# Patient Record
Sex: Female | Born: 1995 | Race: Black or African American | Hispanic: No | Marital: Single | Smoking: Current every day smoker
Health system: Southern US, Community
[De-identification: ages and names within clinical notes are randomized; demographics above are authoritative.]

## PROBLEM LIST (undated history)

## (undated) DIAGNOSIS — A549 Gonococcal infection, unspecified: Secondary | ICD-10-CM

## (undated) DIAGNOSIS — F32A Depression, unspecified: Secondary | ICD-10-CM

## (undated) DIAGNOSIS — A749 Chlamydial infection, unspecified: Secondary | ICD-10-CM

## (undated) DIAGNOSIS — D649 Anemia, unspecified: Secondary | ICD-10-CM

## (undated) DIAGNOSIS — J45909 Unspecified asthma, uncomplicated: Secondary | ICD-10-CM

## (undated) DIAGNOSIS — R519 Headache, unspecified: Secondary | ICD-10-CM

## (undated) DIAGNOSIS — A599 Trichomoniasis, unspecified: Secondary | ICD-10-CM

## (undated) DIAGNOSIS — F419 Anxiety disorder, unspecified: Secondary | ICD-10-CM

## (undated) DIAGNOSIS — N390 Urinary tract infection, site not specified: Secondary | ICD-10-CM

## (undated) HISTORY — PX: NO PAST SURGERIES: SHX2092

## (undated) HISTORY — DX: Unspecified asthma, uncomplicated: J45.909

## (undated) HISTORY — PX: INDUCED ABORTION: SHX677

---

## 2018-08-24 ENCOUNTER — Other Ambulatory Visit: Payer: Self-pay

## 2018-08-24 ENCOUNTER — Encounter: Payer: Self-pay | Admitting: Obstetrics and Gynecology

## 2018-08-24 ENCOUNTER — Ambulatory Visit: Payer: Self-pay | Admitting: Emergency Medicine

## 2018-08-24 DIAGNOSIS — Z3200 Encounter for pregnancy test, result unknown: Secondary | ICD-10-CM

## 2018-08-24 DIAGNOSIS — Z3201 Encounter for pregnancy test, result positive: Secondary | ICD-10-CM

## 2018-08-24 LAB — POCT PREGNANCY, URINE: Preg Test, Ur: POSITIVE — AB

## 2018-08-24 NOTE — Progress Notes (Signed)
Pt here today for a UPT. UPT has a positive result today. Pt LMP 07/06/18, EDD 04/11/18, [place] [redacted]w[redacted]d. Pt medications and allergies reconciled. Pt given a list of medications safe to take during pregnancy and encouraged to start taking prenatal vitamins. Pt verbalized understanding and had no further questions.

## 2018-08-24 NOTE — Progress Notes (Signed)
Chart reviewed for nurse visit. Agree with plan of care.   Wende Mott, [place] 08/24/2018 10:22 AM

## 2018-08-25 ENCOUNTER — Telehealth (INDEPENDENT_AMBULATORY_CARE_PROVIDER_SITE_OTHER): Payer: Self-pay | Admitting: *Deleted

## 2018-08-25 ENCOUNTER — Other Ambulatory Visit: Payer: Self-pay

## 2018-08-25 ENCOUNTER — Inpatient Hospital Stay (HOSPITAL_COMMUNITY): Payer: Self-pay

## 2018-08-25 ENCOUNTER — Telehealth: Payer: Self-pay

## 2018-08-25 ENCOUNTER — Inpatient Hospital Stay (HOSPITAL_COMMUNITY)
Admission: AD | Admit: 2018-08-25 | Discharge: 2018-08-25 | Disposition: A | Discharge status: Home / Self Care | Payer: Self-pay | Attending: Obstetrics & Gynecology | Admitting: Obstetrics & Gynecology

## 2018-08-25 ENCOUNTER — Encounter (HOSPITAL_COMMUNITY): Payer: Self-pay | Admitting: Advanced Practice Midwife

## 2018-08-25 ENCOUNTER — Inpatient Hospital Stay: Admission: RE | Admit: 2018-08-25 | Payer: Self-pay | Source: Ambulatory Visit

## 2018-08-25 DIAGNOSIS — Z3480 Encounter for supervision of other normal pregnancy, unspecified trimester: Secondary | ICD-10-CM

## 2018-08-25 DIAGNOSIS — O26891 Other specified pregnancy related conditions, first trimester: Secondary | ICD-10-CM | POA: Insufficient documentation

## 2018-08-25 DIAGNOSIS — Z3A01 Less than 8 weeks gestation of pregnancy: Secondary | ICD-10-CM | POA: Insufficient documentation

## 2018-08-25 DIAGNOSIS — O418X10 Other specified disorders of amniotic fluid and membranes, first trimester, not applicable or unspecified: Secondary | ICD-10-CM

## 2018-08-25 DIAGNOSIS — O468X1 Other antepartum hemorrhage, first trimester: Secondary | ICD-10-CM

## 2018-08-25 DIAGNOSIS — O208 Other hemorrhage in early pregnancy: Secondary | ICD-10-CM

## 2018-08-25 DIAGNOSIS — R109 Unspecified abdominal pain: Secondary | ICD-10-CM

## 2018-08-25 DIAGNOSIS — M545 Low back pain: Secondary | ICD-10-CM | POA: Insufficient documentation

## 2018-08-25 LAB — URINALYSIS, ROUTINE W REFLEX MICROSCOPIC
Bilirubin Urine: NEGATIVE
Glucose, UA: NEGATIVE mg/dL
Hgb urine dipstick: NEGATIVE
Ketones, ur: NEGATIVE mg/dL
Leukocytes,Ua: NEGATIVE
Nitrite: NEGATIVE
Protein, ur: NEGATIVE mg/dL
Specific Gravity, Urine: 1.01 (ref 1.005–1.030)
pH: 6 (ref 5.0–8.0)

## 2018-08-25 LAB — CBC WITH DIFFERENTIAL/PLATELET
Abs Immature Granulocytes: 0.07 10*3/uL (ref 0.00–0.07)
Basophils Absolute: 0 10*3/uL (ref 0.0–0.1)
Basophils Relative: 0 %
Eosinophils Absolute: 0 10*3/uL (ref 0.0–0.5)
Eosinophils Relative: 0 %
HCT: 36 % (ref 36.0–46.0)
Hemoglobin: 12.3 g/dL (ref 12.0–15.0)
Immature Granulocytes: 1 %
Lymphocytes Relative: 25 %
Lymphs Abs: 2.6 10*3/uL (ref 0.7–4.0)
MCH: 31.2 pg (ref 26.0–34.0)
MCHC: 34.2 g/dL (ref 30.0–36.0)
MCV: 91.4 fL (ref 80.0–100.0)
Monocytes Absolute: 0.5 10*3/uL (ref 0.1–1.0)
Monocytes Relative: 5 %
Neutro Abs: 7.3 10*3/uL (ref 1.7–7.7)
Neutrophils Relative %: 69 %
Platelets: 271 10*3/uL (ref 150–400)
RBC: 3.94 MIL/uL (ref 3.87–5.11)
RDW: 13.9 % (ref 11.5–15.5)
WBC: 10.5 10*3/uL (ref 4.0–10.5)
nRBC: 0 % (ref 0.0–0.2)

## 2018-08-25 LAB — WET PREP, GENITAL
Clue Cells Wet Prep HPF POC: NONE SEEN
Sperm: NONE SEEN
Trich, Wet Prep: NONE SEEN
Yeast Wet Prep HPF POC: NONE SEEN

## 2018-08-25 LAB — HCG, QUANTITATIVE, PREGNANCY: hCG, Beta Chain, Quant, S: 43973 m[IU]/mL — ABNORMAL HIGH (ref <5)

## 2018-08-25 NOTE — MAU Note (Signed)
Pt eating [employer] in lobby, when called her name.  Having cramps, severe in lower abd, started earlier in the week. Had some spotting 2 or 3 days ago, none now.  Pregnancy confirmed at a clinic on [facility], "has my chart" with them,

## 2018-08-25 NOTE — ED Notes (Signed)
Sam in MAU given report on patient, transport called to take patient over to MAU

## 2018-08-25 NOTE — Discharge Instructions (Signed)
First Trimester of Pregnancy  The first trimester of pregnancy is from week 1 until the end of week 13 (months 1 through 3). During this time, your baby will begin to develop inside you. At 6-8 weeks, the eyes and face are formed, and the heartbeat can be seen on ultrasound. At the end of 12 weeks, all the baby's organs are formed. Prenatal care is all the medical care you receive before the birth of your baby. Make sure you get good prenatal care and follow all of your doctor's instructions. Follow these instructions at home: Medicines  Take over-the-counter and prescription medicines only as told by your doctor. Some medicines are safe and some medicines are not safe during pregnancy.  Take a prenatal vitamin that contains at least 600 micrograms (mcg) of folic acid.  If you have trouble pooping (constipation), take medicine that will make your stool soft (stool softener) if your doctor approves. Eating and drinking   Eat regular, healthy meals.  Your doctor will tell you the amount of weight gain that is right for you.  Avoid raw meat and uncooked cheese.  If you feel sick to your stomach (nauseous) or throw up (vomit): ? Eat 4 or 5 small meals a day instead of 3 large meals. ? Try eating a few soda crackers. ? Drink liquids between meals instead of during meals.  To prevent constipation: ? Eat foods that are high in fiber, like fresh fruits and vegetables, whole grains, and beans. ? Drink enough fluids to keep your pee (urine) clear or pale yellow. Activity  Exercise only as told by your doctor. Stop exercising if you have cramps or pain in your lower belly (abdomen) or low back.  Do not exercise if it is too hot, too humid, or if you are in a place of great height (high altitude).  Try to avoid standing for long periods of time. Move your legs often if you must stand in one place for a long time.  Avoid heavy lifting.  Wear low-heeled shoes. Sit and stand up straight.   You can have sex unless your doctor tells you not to. Relieving pain and discomfort  Wear a good support bra if your breasts are sore.  Take warm water baths (sitz baths) to soothe pain or discomfort caused by hemorrhoids. Use hemorrhoid cream if your doctor says it is okay.  Rest with your legs raised if you have leg cramps or low back pain.  If you have puffy, bulging veins (varicose veins) in your legs: ? Wear support hose or compression stockings as told by your doctor. ? Raise (elevate) your feet for 15 minutes, 3-4 times a day. ? Limit salt in your food. Prenatal care  Schedule your prenatal visits by the twelfth week of pregnancy.  Write down your questions. Take them to your prenatal visits.  Keep all your prenatal visits as told by your doctor. This is important. Safety  Wear your seat belt at all times when driving.  Make a list of emergency phone numbers. The list should include numbers for family, friends, the hospital, and police and fire departments. General instructions  Ask your doctor for a referral to a local prenatal class. Begin classes no later than at the start of month 6 of your pregnancy.  Ask for help if you need counseling or if you need help with nutrition. Your doctor can give you advice or tell you where to go for help.  Do not use hot tubs, steam   rooms, or saunas.  Do not douche or use tampons or scented sanitary pads.  Do not cross your legs for long periods of time.  Avoid all herbs and alcohol. Avoid drugs that are not approved by your doctor.  Do not use any tobacco products, including cigarettes, chewing tobacco, and electronic cigarettes. If you need help quitting, ask your doctor. You may get counseling or other support to help you quit.  Avoid cat litter boxes and soil used by cats. These carry germs that can cause birth defects in the baby and can cause a loss of your baby (miscarriage) or stillbirth.  Visit your dentist. At home,  brush your teeth with a soft toothbrush. Be gentle when you floss. Contact a doctor if:  You are dizzy.  You have mild cramps or pressure in your lower belly.  You have a nagging pain in your belly area.  You continue to feel sick to your stomach, you throw up, or you have watery poop (diarrhea).  You have a bad smelling fluid coming from your vagina.  You have pain when you pee (urinate).  You have increased puffiness (swelling) in your face, hands, legs, or ankles. Get help right away if:  You have a fever.  You are leaking fluid from your vagina.  You have spotting or bleeding from your vagina.  You have very bad belly cramping or pain.  You gain or lose weight rapidly.  You throw up blood. It may look like coffee grounds.  You are around people who have German measles, fifth disease, or chickenpox.  You have a very bad headache.  You have shortness of breath.  You have any kind of trauma, such as from a fall or a car accident. Summary  The first trimester of pregnancy is from week 1 until the end of week 13 (months 1 through 3).  To take care of yourself and your unborn baby, you will need to eat healthy meals, take medicines only if your doctor tells you to do so, and do activities that are safe for you and your baby.  Keep all follow-up visits as told by your doctor. This is important as your doctor will have to ensure that your baby is healthy and growing well. This information is not intended to replace advice given to you by your health care provider. Make sure you discuss any questions you have with your health care provider. Document Released: 08/21/2007 Document Revised: 03/12/2016 Document Reviewed: 03/12/2016 Elsevier Interactive Patient Education  2019 Elsevier Inc.  

## 2018-08-25 NOTE — MAU Note (Signed)
Pt not in lobby.  

## 2018-08-25 NOTE — MAU Provider Note (Signed)
History     CSN: [id number]  Arrival date and time: 08/25/18 1326   None    Chief Complaint  Patient presents with  . Abdominal Pain   HPI Kristin Brock is a 23 y.o. G2P1 with LMP "sometime in April" who presents to MAU with chief complaint of suprapubic and low back pain. Patient reports new onset suprapubic cramping which began two days ago. She reports new onset low back pain this morning while working her job at [employer]. She denies vaginal bleeding, abdominal tenderness, dysuria, abnormal vaginal discharge, fever or recent illness.   Patient has not had imaging or care yet this pregnancy.  She is taking an OTC gummy prenatal vitamin but no other medications.  Patient lives with her partner and their child. She denies concern for IPV. She denies SI or HI.  OB History    Gravida  2   Para  1   Term  1   Preterm  0   AB  0   Living  1     SAB      TAB      Ectopic      Multiple      Live Births             No family history on file.  Social History   Tobacco Use  . Smoking status: Not on file  Substance Use Topics  . Alcohol use: Not on file  . Drug use: Not on file    Allergies: Not on File  No medications prior to admission.    Review of Systems  Constitutional: Negative for chills, fatigue and fever.  Respiratory: Negative for shortness of breath.   Gastrointestinal: Positive for abdominal pain.  Genitourinary: Negative for difficulty urinating, dysuria, vaginal bleeding, vaginal discharge and vaginal pain.  Musculoskeletal: Positive for back pain.  Neurological: Negative for headaches.  All other systems reviewed and are negative.  Physical Exam   Last menstrual period 07/06/2018.  Physical Exam  Nursing note and vitals reviewed. Constitutional: She is oriented to person, place, and time. She appears well-developed and well-nourished.  Cardiovascular: Normal rate.  Respiratory: Effort normal.  GI: Soft. She exhibits no  distension. There is no abdominal tenderness. There is no rebound and no guarding.  Genitourinary:    No vaginal discharge.   Neurological: She is alert and oriented to person, place, and time.  Skin: Skin is warm and dry.  Psychiatric: She has a normal mood and affect. Her behavior is normal. Judgment and thought content normal.    MAU Course/MDM  Procedures  Patient Vitals for the past 24 hrs:  BP Temp Temp src Pulse Resp SpO2 Height Weight  08/25/18 1412 123/68 98.4 F (36.9 C) Oral 69 17 100 % 5\' 4"  (1.626 m) 76.2 kg    Results for orders placed or performed during the hospital encounter of 08/25/18 (from the past 24 hour(s))  Urinalysis, Routine w reflex microscopic     Status: None   Collection Time: 08/25/18  2:40 PM  Result Value Ref Range   Color, Urine YELLOW YELLOW   APPearance CLEAR CLEAR   Specific Gravity, Urine 1.010 1.005 - 1.030   pH 6.0 5.0 - 8.0   Glucose, UA NEGATIVE NEGATIVE mg/dL   Hgb urine dipstick NEGATIVE NEGATIVE   Bilirubin Urine NEGATIVE NEGATIVE   Ketones, ur NEGATIVE NEGATIVE mg/dL   Protein, ur NEGATIVE NEGATIVE mg/dL   Nitrite NEGATIVE NEGATIVE   Leukocytes,Ua NEGATIVE NEGATIVE  Wet prep, genital  Status: Abnormal   Collection Time: 08/25/18  2:40 PM  Result Value Ref Range   Yeast Wet Prep HPF POC NONE SEEN NONE SEEN   Trich, Wet Prep NONE SEEN NONE SEEN   Clue Cells Wet Prep HPF POC NONE SEEN NONE SEEN   WBC, Wet Prep HPF POC MODERATE (A) NONE SEEN   Sperm NONE SEEN   CBC with Differential/Platelet     Status: None   Collection Time: 08/25/18  3:18 PM  Result Value Ref Range   WBC 10.5 4.0 - 10.5 K/uL   RBC 3.94 3.87 - 5.11 MIL/uL   Hemoglobin 12.3 12.0 - 15.0 g/dL   HCT 36.0 36.0 - 46.0 %   MCV 91.4 80.0 - 100.0 fL   MCH 31.2 26.0 - 34.0 pg   MCHC 34.2 30.0 - 36.0 g/dL   RDW 13.9 11.5 - 15.5 %   Platelets 271 150 - 400 K/uL   nRBC 0.0 0.0 - 0.2 %   Neutrophils Relative % 69 %   Neutro Abs 7.3 1.7 - 7.7 K/uL   Lymphocytes  Relative 25 %   Lymphs Abs 2.6 0.7 - 4.0 K/uL   Monocytes Relative 5 %   Monocytes Absolute 0.5 0.1 - 1.0 K/uL   Eosinophils Relative 0 %   Eosinophils Absolute 0.0 0.0 - 0.5 K/uL   Basophils Relative 0 %   Basophils Absolute 0.0 0.0 - 0.1 K/uL   Immature Granulocytes 1 %   Abs Immature Granulocytes 0.07 0.00 - 0.07 K/uL  hCG, quantitative, pregnancy     Status: Abnormal   Collection Time: 08/25/18  3:18 PM  Result Value Ref Range   hCG, Beta Chain, Quant, S 43,973 (H) <5 mIU/mL  ABO/Rh     Status: None   Collection Time: 08/25/18  3:18 PM  Result Value Ref Range   ABO/RH(D)      B POS Performed at [facility], [address], [place], [place] [address]     US Ob Less Than 14 Weeks With Ob Transvaginal  Result Date: 08/25/2018 CLINICAL DATA:  Abdominal pain in 1st trimester pregnancy. EXAM: OBSTETRIC <14 WK [place] AND TRANSVAGINAL OB US TECHNIQUE: Both transabdominal and transvaginal ultrasound examinations were performed for complete evaluation of the gestation as well as the maternal uterus, adnexal regions, and pelvic cul-de-sac. Transvaginal technique was performed to assess early pregnancy. COMPARISON:  None. FINDINGS: Intrauterine gestational sac: Single Yolk sac:  Visualized. Embryo:  Visualized. Cardiac Activity: Visualized. Heart Rate: 120 bpm CRL:  6.5 mm   6 w   3 d                  [place] EDC: 04/17/2019 Subchorionic hemorrhage: Moderate subchorionic hemorrhage Maternal uterus/adnexae: Retroverted uterus. Normal appearance of both ovaries. Small right ovarian corpus luteum noted. No adnexal mass or abnormal free fluid identified. IMPRESSION: Single living IUP measuring 6 weeks 3 days, with [place] EDC of 04/17/2019. Moderate subchorionic hemorrhage. Electronically Signed   By: Earle Gell M.D.   On: 08/25/2018 15:57    Assessment and Plan  --23 y.o. G2P1001 at [redacted]w[redacted]d by US performed today --Subchorionic hematoma, advised pelvic rest, possibility of future bleeding episode --First  trimester precautions reviewed with patient --Discharge home in stable condition  F/U: Patient to schedule prenatal care at 11-13 weeks [place], [place] 08/25/2018, 4:53 PM

## 2018-08-25 NOTE — Telephone Encounter (Signed)
The patient called in, in tears stating she is in pain. She has cramps running from her stomach to her butt. She stated she would like to speak with a nurse. She also stated she is at work and has been on her feet all day. She stated that the pain started to lighten up during the call. Informed the patient of visiting the hospital if the pain is at a point where it is uncomfortable. Informed the patient of an urgent message being sent to a nurse. °

## 2018-08-25 NOTE — Telephone Encounter (Signed)
Pt called stating that she has been experiencing cramps that go all the way to her butt and she is concerned.  Pt requesting a call back.   Returned pt's call.  Pt in the waiting room at the emergency room at time of call and is waiting to be seen.  Advised pt to contact the clinic regarding follow up from her visit today.  Pt verbalized understanding.

## 2018-08-25 NOTE — Telephone Encounter (Signed)
The patient called in, in tears stating she is in pain. She has cramps running from her stomach to her butt. She stated she would like to speak with a nurse. She also stated she is at work and has been on her feet all day. She stated that the pain started to lighten up during the call. Informed the patient of visiting the hospital if the pain is at a point where it is uncomfortable. Informed the patient of an urgent message being sent to a nurse.

## 2018-08-26 LAB — ABO/RH: ABO/RH(D): B POS

## 2018-08-27 LAB — GC/CHLAMYDIA PROBE AMP (~~LOC~~) NOT AT ARMC
Chlamydia: NEGATIVE
Neisseria Gonorrhea: NEGATIVE

## 2018-08-28 ENCOUNTER — Telehealth: Payer: Self-pay | Admitting: Family Medicine

## 2018-08-28 NOTE — Telephone Encounter (Signed)
Spoke with patient about her appointment on 06/15 @ 9:15. Patient was instructed that this visit will be a mychart visit. Patient instructed that she needs to downloaded the mychart app. Patient verbalized that she already has the app downloaded and knows how to access the appointment. Patient was not screened for symptoms due to the visit being a virtual visit.

## 2018-08-31 ENCOUNTER — Other Ambulatory Visit: Payer: Self-pay

## 2018-08-31 ENCOUNTER — Telehealth (INDEPENDENT_AMBULATORY_CARE_PROVIDER_SITE_OTHER): Payer: Self-pay | Admitting: *Deleted

## 2018-08-31 DIAGNOSIS — Z3480 Encounter for supervision of other normal pregnancy, unspecified trimester: Secondary | ICD-10-CM

## 2018-08-31 DIAGNOSIS — O99311 Alcohol use complicating pregnancy, first trimester: Secondary | ICD-10-CM | POA: Insufficient documentation

## 2018-08-31 NOTE — Progress Notes (Signed)
I have reviewed this chart and agree with the RN/CMA assessment and management.    Breunna Nordmann C Shaymus Eveleth, MD, FACOG Attending Physician, [facility]  

## 2018-08-31 NOTE — Progress Notes (Signed)
I connected with  Gwyneth Revels on 08/31/18 at 0855 by telephone and verified that I am speaking with the correct person using two identifiers.   I discussed the limitations, risks, security and privacy concerns of performing an evaluation and management service by telephone and virtually and the availability of in person appointments. I also discussed with the patient that there may be a patient responsible charge related to this service. The patient expressed understanding and agreed to proceed. She confirms her LMP of 07/06/18 was approximate- she was not sure of the exact date.   She states she does not drink alcohol- "just wine " about 2- 3 glasses per day. Advised she should not drink any kind of alcohol in pregnancy and she should stop- discussed this includes wine,beer, liquor, etc.  She agreed to and I sent her [employer] app for her to download. We discussed once she has her medicaid we will send her a bp cuff so she can take her bp and record into [employer].  We discussed when her new ob appt will be with provider and that exam and blood work will be done then and genetic testing if desired.  I advised her to wear mask to all visits.  Linda,RN 08/31/2018  8:55 AM

## 2018-09-10 NOTE — Telephone Encounter (Signed)
Opened in error

## 2018-09-25 ENCOUNTER — Telehealth: Payer: Self-pay | Admitting: Obstetrics and Gynecology

## 2018-09-25 NOTE — Telephone Encounter (Signed)
Called the patient to complete the pre-screen. The patient answered no to COVID19 symptoms and/or being previously diagnosed. Informed the patient of the wearing a face mask, sanitizing hands at the sanitizing station upon entering our office, and no visitors or children are allowed due to the COVID19 restrictions. The patient verbalized understanding. °

## 2018-09-28 ENCOUNTER — Encounter: Payer: Self-pay | Admitting: Obstetrics & Gynecology

## 2018-09-28 ENCOUNTER — Telehealth: Payer: Self-pay | Admitting: Obstetrics & Gynecology

## 2018-09-28 NOTE — Telephone Encounter (Signed)
Called patient to get her rescheduled for her missed new ob appointment on 7/13. Patient was rescheduled for 7/22 @ 2:55.

## 2018-10-06 ENCOUNTER — Telehealth: Payer: Self-pay | Admitting: Obstetrics & Gynecology

## 2018-10-06 NOTE — Telephone Encounter (Signed)
NON-COVID-19 pt. based on the phone screening algorithm. °

## 2018-10-07 ENCOUNTER — Ambulatory Visit (INDEPENDENT_AMBULATORY_CARE_PROVIDER_SITE_OTHER): Payer: Self-pay | Admitting: Obstetrics & Gynecology

## 2018-10-07 ENCOUNTER — Encounter: Payer: Self-pay | Admitting: Obstetrics & Gynecology

## 2018-10-07 ENCOUNTER — Other Ambulatory Visit: Payer: Self-pay

## 2018-10-07 VITALS — BP 104/68 | HR 80 | Temp 98.3°F | Wt 164.5 lb

## 2018-10-07 DIAGNOSIS — N898 Other specified noninflammatory disorders of vagina: Secondary | ICD-10-CM

## 2018-10-07 DIAGNOSIS — Z124 Encounter for screening for malignant neoplasm of cervix: Secondary | ICD-10-CM

## 2018-10-07 DIAGNOSIS — A5901 Trichomonal vulvovaginitis: Secondary | ICD-10-CM

## 2018-10-07 DIAGNOSIS — O2341 Unspecified infection of urinary tract in pregnancy, first trimester: Secondary | ICD-10-CM

## 2018-10-07 DIAGNOSIS — O418X10 Other specified disorders of amniotic fluid and membranes, first trimester, not applicable or unspecified: Secondary | ICD-10-CM

## 2018-10-07 DIAGNOSIS — Z113 Encounter for screening for infections with a predominantly sexual mode of transmission: Secondary | ICD-10-CM

## 2018-10-07 DIAGNOSIS — Z3480 Encounter for supervision of other normal pregnancy, unspecified trimester: Secondary | ICD-10-CM

## 2018-10-07 DIAGNOSIS — O23591 Infection of other part of genital tract in pregnancy, first trimester: Secondary | ICD-10-CM

## 2018-10-07 DIAGNOSIS — B373 Candidiasis of vulva and vagina: Secondary | ICD-10-CM

## 2018-10-07 DIAGNOSIS — O99311 Alcohol use complicating pregnancy, first trimester: Secondary | ICD-10-CM

## 2018-10-07 DIAGNOSIS — O468X1 Other antepartum hemorrhage, first trimester: Secondary | ICD-10-CM

## 2018-10-07 DIAGNOSIS — B3731 Acute candidiasis of vulva and vagina: Secondary | ICD-10-CM

## 2018-10-07 NOTE — Patient Instructions (Signed)
First Trimester of Pregnancy The first trimester of pregnancy is from week 1 until the end of week 13 (months 1 through 3). A week after a sperm fertilizes an egg, the egg will implant on the wall of the uterus. This embryo will begin to develop into a baby. Genes from you and your partner will form the baby. The female genes will determine whether the baby will be a boy or a girl. At 6-8 weeks, the eyes and face will be formed, and the heartbeat can be seen on ultrasound. At the end of 12 weeks, all the baby's organs will be formed. Now that you are pregnant, you will want to do everything you can to have a healthy baby. Two of the most important things are to get good prenatal care and to follow your health care provider's instructions. Prenatal care is all the medical care you receive before the baby's birth. This care will help prevent, find, and treat any problems during the pregnancy and childbirth. Body changes during your first trimester Your body goes through many changes during pregnancy. The changes vary from woman to woman.  You may gain or lose a couple of pounds at first.  You may feel sick to your stomach (nauseous) and you may throw up (vomit). If the vomiting is uncontrollable, call your health care provider.  You may tire easily.  You may develop headaches that can be relieved by medicines. All medicines should be approved by your health care provider.  You may urinate more often. Painful urination may mean you have a bladder infection.  You may develop heartburn as a result of your pregnancy.  You may develop constipation because certain hormones are causing the muscles that push stool through your intestines to slow down.  You may develop hemorrhoids or swollen veins (varicose veins).  Your breasts may begin to grow larger and become tender. Your nipples may stick out more, and the tissue that surrounds them (areola) may become darker.  Your gums may bleed and may be  sensitive to brushing and flossing.  Dark spots or blotches (chloasma, mask of pregnancy) may develop on your face. This will likely fade after the baby is born.  Your menstrual periods will stop.  You may have a loss of appetite.  You may develop cravings for certain kinds of food.  You may have changes in your emotions from day to day, such as being excited to be pregnant or being concerned that something may go wrong with the pregnancy and baby.  You may have more vivid and strange dreams.  You may have changes in your hair. These can include thickening of your hair, rapid growth, and changes in texture. Some women also have hair loss during or after pregnancy, or hair that feels dry or thin. Your hair will most likely return to normal after your baby is born. What to expect at prenatal visits During a routine prenatal visit:  You will be weighed to make sure you and the baby are growing normally.  Your blood pressure will be taken.  Your abdomen will be measured to track your baby's growth.  The fetal heartbeat will be listened to between weeks 10 and 14 of your pregnancy.  Test results from any previous visits will be discussed. Your health care provider may ask you:  How you are feeling.  If you are feeling the baby move.  If you have had any abnormal symptoms, such as leaking fluid, bleeding, severe headaches, or abdominal   cramping.  If you are using any tobacco products, including cigarettes, chewing tobacco, and electronic cigarettes.  If you have any questions. Other tests that may be performed during your first trimester include:  Blood tests to find your blood type and to check for the presence of any previous infections. The tests will also be used to check for low iron levels (anemia) and protein on red blood cells (Rh antibodies). Depending on your risk factors, or if you previously had diabetes during pregnancy, you may have tests to check for high blood sugar  that affects pregnant women (gestational diabetes).  Urine tests to check for infections, diabetes, or protein in the urine.  An ultrasound to confirm the proper growth and development of the baby.  Fetal screens for spinal cord problems (spina bifida) and Down syndrome.  HIV (human immunodeficiency virus) testing. Routine prenatal testing includes screening for HIV, unless you choose not to have this test.  You may need other tests to make sure you and the baby are doing well. Follow these instructions at home: Medicines  Follow your health care provider's instructions regarding medicine use. Specific medicines may be either safe or unsafe to take during pregnancy.  Take a prenatal vitamin that contains at least 600 micrograms (mcg) of folic acid.  If you develop constipation, try taking a stool softener if your health care provider approves. Eating and drinking   Eat a balanced diet that includes fresh fruits and vegetables, whole grains, good sources of protein such as meat, eggs, or tofu, and low-fat dairy. Your health care provider will help you determine the amount of weight gain that is right for you.  Avoid raw meat and uncooked cheese. These carry germs that can cause birth defects in the baby.  Eating four or five small meals rather than three large meals a day may help relieve nausea and vomiting. If you start to feel nauseous, eating a few soda crackers can be helpful. Drinking liquids between meals, instead of during meals, also seems to help ease nausea and vomiting.  Limit foods that are high in fat and processed sugars, such as fried and sweet foods.  To prevent constipation: ? Eat foods that are high in fiber, such as fresh fruits and vegetables, whole grains, and beans. ? Drink enough fluid to keep your urine clear or pale yellow. Activity  Exercise only as directed by your health care provider. Most women can continue their usual exercise routine during  pregnancy. Try to exercise for 30 minutes at least 5 days a week. Exercising will help you: ? Control your weight. ? Stay in shape. ? Be prepared for labor and delivery.  Experiencing pain or cramping in the lower abdomen or lower back is a good sign that you should stop exercising. Check with your health care provider before continuing with normal exercises.  Try to avoid standing for long periods of time. Move your legs often if you must stand in one place for a long time.  Avoid heavy lifting.  Wear low-heeled shoes and practice good posture.  You may continue to have sex unless your health care provider tells you not to. Relieving pain and discomfort  Wear a good support bra to relieve breast tenderness.  Take warm sitz baths to soothe any pain or discomfort caused by hemorrhoids. Use hemorrhoid cream if your health care provider approves.  Rest with your legs elevated if you have leg cramps or low back pain.  If you develop varicose veins in   your legs, wear support hose. Elevate your feet for 15 minutes, 3-4 times a day. Limit salt in your diet. Prenatal care  Schedule your prenatal visits by the twelfth week of pregnancy. They are usually scheduled monthly at first, then more often in the last 2 months before delivery.  Write down your questions. Take them to your prenatal visits.  Keep all your prenatal visits as told by your health care provider. This is important. Safety  Wear your seat belt at all times when driving.  Make a list of emergency phone numbers, including numbers for family, friends, the hospital, and police and fire departments. General instructions  Ask your health care provider for a referral to a local prenatal education class. Begin classes no later than the beginning of month 6 of your pregnancy.  Ask for help if you have counseling or nutritional needs during pregnancy. Your health care provider can offer advice or refer you to specialists for help  with various needs.  Do not use hot tubs, steam rooms, or saunas.  Do not douche or use tampons or scented sanitary pads.  Do not cross your legs for long periods of time.  Avoid cat litter boxes and soil used by cats. These carry germs that can cause birth defects in the baby and possibly loss of the fetus by miscarriage or stillbirth.  Avoid all smoking, herbs, alcohol, and medicines not prescribed by your health care provider. Chemicals in these products affect the formation and growth of the baby.  Do not use any products that contain nicotine or tobacco, such as cigarettes and e-cigarettes. If you need help quitting, ask your health care provider. You may receive counseling support and other resources to help you quit.  Schedule a dentist appointment. At home, brush your teeth with a soft toothbrush and be gentle when you floss. Contact a health care provider if:  You have dizziness.  You have mild pelvic cramps, pelvic pressure, or nagging pain in the abdominal area.  You have persistent nausea, vomiting, or diarrhea.  You have a bad smelling vaginal discharge.  You have pain when you urinate.  You notice increased swelling in your face, hands, legs, or ankles.  You are exposed to fifth disease or chickenpox.  You are exposed to German measles (rubella) and have never had it. Get help right away if:  You have a fever.  You are leaking fluid from your vagina.  You have spotting or bleeding from your vagina.  You have severe abdominal cramping or pain.  You have rapid weight gain or loss.  You vomit blood or material that looks like coffee grounds.  You develop a severe headache.  You have shortness of breath.  You have any kind of trauma, such as from a fall or a car accident. Summary  The first trimester of pregnancy is from week 1 until the end of week 13 (months 1 through 3).  Your body goes through many changes during pregnancy. The changes vary from  woman to woman.  You will have routine prenatal visits. During those visits, your health care provider will examine you, discuss any test results you may have, and talk with you about how you are feeling. This information is not intended to replace advice given to you by your health care provider. Make sure you discuss any questions you have with your health care provider. Document Released: 02/26/2001 Document Revised: 02/14/2017 Document Reviewed: 02/14/2016 Elsevier Patient Education  2020 [employer]  

## 2018-10-07 NOTE — Progress Notes (Signed)
History:   Kristin Brock is a 23 y.o. G2P1001 at [redacted]w[redacted]d by early ultrasound being seen today for her first obstetrical visit.  Her obstetrical history is significant for alcohol use in first trimester. Patient does intend to breast feed. Pregnancy history fully reviewed.  Patient reports no complaints.      HISTORY: OB History  Gravida Para Term Preterm AB Living  2 1 1  0 0 1  SAB TAB Ectopic Multiple Live Births  0 0 0 0 1    # Outcome Date GA Lbr Len/2nd Weight Sex Delivery Anes PTL Lv  2 Current           1 Term 03/22/14   7 lb (3.175 kg) F Vag-Spont   LIV     Birth Comments: fainting, asthma exacerbations     Past Medical History:  Diagnosis Date  . Asthma    History reviewed. No pertinent surgical history. History reviewed. No pertinent family history. Social History   Tobacco Use  . Smoking status: Current Every Day Smoker    Packs/day: 0.25    Types: Cigarettes  . Smokeless tobacco: Never Used  Substance Use Topics  . Alcohol use: Yes    Alcohol/week: 3.0 standard drinks    Types: 3 Glasses of wine per week    Comment: daily  . Drug use: Not Currently    Types: Marijuana    Comment: last used 2019   No Known Allergies Current Outpatient Medications on File Prior to Visit  Medication Sig Dispense Refill  . Prenatal Vit-Fe Fumarate-FA (PRENATAL VITAMINS PO) Take 1 tablet by mouth daily.     No current facility-administered medications on file prior to visit.     Review of Systems Pertinent items noted in HPI and remainder of comprehensive ROS otherwise negative. Physical Exam:   Vitals:   10/07/18 1529  BP: 104/68  Pulse: 80  Temp: 98.3 F (36.8 C)  Weight: 164 lb 8 oz (74.6 kg)   Fetal Heart Rate (bpm): 151 Uterus:     Pelvic Exam: Perineum: no hemorrhoids, normal perineum   Vulva: normal external genitalia, no lesions   Vagina:  normal mucosa, normal discharge   Cervix: no lesions and normal, pap smear done.    Adnexa: normal adnexa and no  mass, fullness, tenderness   Bony Pelvis: average  System: General: well-developed, well-nourished female in no acute distress   Breasts:  normal appearance, no masses or tenderness bilaterally   Skin: normal coloration and turgor, no rashes   Neurologic: oriented, normal, negative, normal mood   Extremities: normal strength, tone, and muscle mass, ROM of all joints is normal   HEENT PERRLA, extraocular movement intact and sclera clear, anicteric   Mouth/Teeth mucous membranes moist, pharynx normal without lesions and dental hygiene good   Neck supple and no masses   Cardiovascular: regular rate and rhythm   Respiratory:  no respiratory distress, normal breath sounds   Abdomen: soft, non-tender; bowel sounds normal; no masses,  no organomegaly    Assessment:    Pregnancy: G2P1001 Patient Active Problem List   Diagnosis Date Noted  . 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 Supervision of other normal pregnancy, antepartum 08/25/2018  . Subchorionic hemorrhage in first trimester 08/25/2018     Plan:    1. 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 - [place] MFM OB DETAIL +14 WK; Future  2. Subchorionic hemorrhage of placenta in first trimester, single or unspecified fetus No  bleeding now. Precautions advised.  3. Supervision of other normal pregnancy, antepartum - Obstetric Panel, Including HIV - Culture, OB Urine - Cytology - PAP - Cervicovaginal ancillary only - Hemoglobin A1c - Comprehensive metabolic panel - TSH Initial labs drawn. Continue prenatal vitamins. Genetic Screening discussed, First trimester screen, Quad screen and NIPS: undecided, is self-pay. Ultrasound discussed; fetal anatomic survey: ordered. Problem list reviewed and updated. The nature of [facility] with multiple MDs and other Advanced Practice Providers was explained to patient; also emphasized that residents, students  are part of our team. Routine obstetric precautions reviewed. Return in about 4 weeks (around 11/04/2018) for Virtual LOB Visit.     Kristin Brock  , MD, FACOG Obstetrician & Gynecologist, [facility], [facility]

## 2018-10-08 DIAGNOSIS — A5901 Trichomonal vulvovaginitis: Secondary | ICD-10-CM | POA: Insufficient documentation

## 2018-10-08 LAB — CYTOLOGY - PAP: Diagnosis: NEGATIVE

## 2018-10-08 MED ORDER — TERCONAZOLE 0.8 % VA CREA: 1.0000 | TOPICAL_CREAM | Freq: Every day | VAGINAL | 0 refills | Status: DC

## 2018-10-08 MED ORDER — METRONIDAZOLE 500 MG PO TABS: 500.0000 mg | ORAL_TABLET | Freq: Two times a day (BID) | ORAL | 0 refills | Status: DC

## 2018-10-08 NOTE — Progress Notes (Signed)
Lab Addendum  Patient has Trichomonal vaginitis.  Results for other STI tests pending at this time.  She also needs to let partner(s) know so the partner(s) can get testing and treatment. Patient and sex partner(s) should abstain from unprotected sexual activity for seven days after everyone receives appropriate treatment.  Metronidazole was prescribed for patient. Of note, yeast was also seen, Terazol was also prescribed for this.   Patient will need to return in about 4 weeks after treatment for repeat test of cure.  Please call to inform patient of results and recommendations, and advise to pick up prescriptions and take as directed.  Verita Schneiders, MD

## 2018-10-08 NOTE — Addendum Note (Signed)
Addended by: Verita Schneiders A on: 10/08/2018 04:38 PM   Modules accepted: Orders

## 2018-10-08 NOTE — Progress Notes (Signed)
Patient has Trichomonas.  Results for other STI tests pending at this time.  She also needs to let partner(s) know so the partner(s) can get testing and treatment. Patient and sex partner(s) should abstain from unprotected sexual activity for seven days after everyone receives appropriate treatment.  Metronidazole was prescribed for patient. Of note, yeast was also seen, Terazol was also prescribed for this.   Patient will need to return in about 4 weeks after treatment for repeat test of cure.  Please call to inform patient of results and recommendations, and advise to pick up prescriptions and take as directed.  Verita Schneiders, MD

## 2018-10-09 ENCOUNTER — Telehealth: Payer: Self-pay | Admitting: Lactation Services

## 2018-10-09 LAB — AB SCR+ANTIBODY ID: Antibody Screen: POSITIVE — AB

## 2018-10-09 LAB — COMPREHENSIVE METABOLIC PANEL
ALT: 17 IU/L (ref 0–32)
AST: 21 IU/L (ref 0–40)
Albumin/Globulin Ratio: 1.5 (ref 1.2–2.2)
Albumin: 4.1 g/dL (ref 3.9–5.0)
Alkaline Phosphatase: 65 IU/L (ref 39–117)
BUN/Creatinine Ratio: 14 (ref 9–23)
BUN: 9 mg/dL (ref 6–20)
Bilirubin Total: <0.2 mg/dL (ref 0.0–1.2)
CO2: 20 mmol/L (ref 20–29)
Calcium: 9.7 mg/dL (ref 8.7–10.2)
Chloride: 100 mmol/L (ref 96–106)
Creatinine, Ser: 0.63 mg/dL (ref 0.57–1.00)
GFR calc Af Amer: 148 mL/min/{1.73_m2} (ref >59)
GFR calc non Af Amer: 129 mL/min/{1.73_m2} (ref >59)
Globulin, Total: 2.8 g/dL (ref 1.5–4.5)
Glucose: 80 mg/dL (ref 65–99)
Potassium: 4.3 mmol/L (ref 3.5–5.2)
Sodium: 136 mmol/L (ref 134–144)
Total Protein: 6.9 g/dL (ref 6.0–8.5)

## 2018-10-09 LAB — OBSTETRIC PANEL, INCLUDING HIV
Basophils Absolute: 0 10*3/uL (ref 0.0–0.2)
Basos: 0 %
EOS (ABSOLUTE): 0.1 10*3/uL (ref 0.0–0.4)
Eos: 1 %
HIV Screen 4th Generation wRfx: NONREACTIVE
Hematocrit: 33.3 % — ABNORMAL LOW (ref 34.0–46.6)
Hemoglobin: 11.6 g/dL (ref 11.1–15.9)
Hepatitis B Surface Ag: NEGATIVE
Immature Grans (Abs): 0.1 10*3/uL (ref 0.0–0.1)
Immature Granulocytes: 1 %
Lymphocytes Absolute: 3 10*3/uL (ref 0.7–3.1)
Lymphs: 25 %
MCH: 31.4 pg (ref 26.6–33.0)
MCHC: 34.8 g/dL (ref 31.5–35.7)
MCV: 90 fL (ref 79–97)
Monocytes Absolute: 0.7 10*3/uL (ref 0.1–0.9)
Monocytes: 6 %
Neutrophils Absolute: 7.8 10*3/uL — ABNORMAL HIGH (ref 1.4–7.0)
Neutrophils: 67 %
Platelets: 277 10*3/uL (ref 150–450)
RBC: 3.7 x10E6/uL — ABNORMAL LOW (ref 3.77–5.28)
RDW: 13.1 % (ref 11.7–15.4)
RPR Ser Ql: NONREACTIVE
Rh Factor: POSITIVE
Rubella Antibodies, IGG: 6.4 index (ref >0.99)
WBC: 11.7 10*3/uL — ABNORMAL HIGH (ref 3.4–10.8)

## 2018-10-09 LAB — HEMOGLOBIN A1C
Est. average glucose Bld gHb Est-mCnc: 111 mg/dL
Hgb A1c MFr Bld: 5.5 % (ref 4.8–5.6)

## 2018-10-09 LAB — CERVICOVAGINAL ANCILLARY ONLY
Bacterial vaginitis: NEGATIVE
Candida vaginitis: POSITIVE — AB
Chlamydia: NEGATIVE
Neisseria Gonorrhea: NEGATIVE
Trichomonas: POSITIVE — AB

## 2018-10-09 LAB — URINE CULTURE, OB REFLEX

## 2018-10-09 LAB — TSH: TSH: 1.88 u[IU]/mL (ref 0.450–4.500)

## 2018-10-09 LAB — CULTURE, OB URINE

## 2018-10-09 MED ORDER — METRONIDAZOLE 500 MG PO TABS: 2000.0000 mg | ORAL_TABLET | Freq: Once | ORAL | 1 refills | Status: AC

## 2018-10-09 NOTE — Telephone Encounter (Signed)
Attempted to call pt to inform her of her positive PAP results and to inform her to pick up her meds and to treat herself and partner for Trich. Pt did not answer. LM for pt to call the office for her lab results. Routed to Clinical Pool for Follow up. Prescription for Trich sent to Pharmacy.

## 2018-10-11 DIAGNOSIS — O2340 Unspecified infection of urinary tract in pregnancy, unspecified trimester: Secondary | ICD-10-CM | POA: Insufficient documentation

## 2018-10-11 DIAGNOSIS — O2341 Unspecified infection of urinary tract in pregnancy, first trimester: Secondary | ICD-10-CM | POA: Insufficient documentation

## 2018-10-11 MED ORDER — CEFADROXIL 500 MG PO CAPS: 500.0000 mg | ORAL_CAPSULE | Freq: Two times a day (BID) | ORAL | 0 refills | Status: DC

## 2018-10-11 NOTE — Addendum Note (Signed)
Addended by: Verita Schneiders A on: 10/11/2018 04:01 PM   Modules accepted: Orders

## 2018-10-12 ENCOUNTER — Telehealth: Payer: Self-pay | Admitting: Lactation Services

## 2018-10-12 NOTE — Telephone Encounter (Signed)
-----   Message from Osborne Oman, MD sent at 10/11/2018  4:01 PM EDT ----- Urine culture showed E.coli UTI, antibiotics ordered. Please call to inform patient of results and advise her to pick up prescription.  Results were also released to MyChart and patient was given recommendations as indicated.

## 2018-10-12 NOTE — Telephone Encounter (Signed)
Called to inform pt of + UTI and to go and pick up her meds. Pt did not answer. Advised pt to call the office back for results or check her MyChart message from Dr. Harolyn Rutherford.

## 2018-10-14 NOTE — Telephone Encounter (Signed)
Attempted to call pt to give her results of her vaginal swab. Pt did not answer asked pt to check her MyChart message

## 2018-11-03 ENCOUNTER — Telehealth: Payer: Self-pay | Admitting: Medical

## 2018-11-03 NOTE — Telephone Encounter (Signed)
Called the patient to inform of the upcoming mychart visit. Informed the patient of logging in at least 15 minutes prior to the appointment time. Informed the patient if the physician is not logged in at least 15 minutes after the appointment please contact our office and we will be sure there isnt a network connection or check to see if the provider is on schedule.

## 2018-11-04 ENCOUNTER — Encounter: Payer: Self-pay | Admitting: Medical

## 2018-11-04 ENCOUNTER — Telehealth: Payer: Self-pay | Admitting: Obstetrics and Gynecology

## 2018-11-04 ENCOUNTER — Other Ambulatory Visit: Payer: Self-pay

## 2018-11-04 ENCOUNTER — Telehealth: Payer: Self-pay | Admitting: Medical

## 2018-11-04 NOTE — Progress Notes (Signed)
1:46p-Called pt for My Chart visit, no answer, left VM that will call back in 10 minutes.    1:55p- 2nd attempt no answer, left VM to reschedule.

## 2018-11-04 NOTE — Telephone Encounter (Signed)
Called the patient and left a message stating please call our office in regards to the missed appointment. Also sending the patient a reminder letter.

## 2018-11-04 NOTE — Patient Instructions (Signed)
Please reschedule your missed appointment as soon as possible 

## 2018-11-17 ENCOUNTER — Other Ambulatory Visit: Payer: Self-pay

## 2018-11-17 ENCOUNTER — Encounter: Payer: Self-pay | Admitting: Medical

## 2018-11-17 ENCOUNTER — Ambulatory Visit (HOSPITAL_COMMUNITY): Payer: Self-pay

## 2018-11-17 ENCOUNTER — Telehealth: Payer: Self-pay

## 2018-11-17 ENCOUNTER — Telehealth: Payer: Self-pay | Admitting: Medical

## 2018-11-17 DIAGNOSIS — Z5329 Procedure and treatment not carried out because of patient's decision for other reasons: Secondary | ICD-10-CM

## 2018-11-17 DIAGNOSIS — Z91199 Patient's noncompliance with other medical treatment and regimen due to unspecified reason: Secondary | ICD-10-CM

## 2018-11-17 NOTE — Progress Notes (Signed)
10:55a- Called pt for My Chart visit, received message saying call cannot e completed at this time, please hang up & call later. Will retry closer to appt..time    11:17a- 2nd attempt same message to call again later, pt will need to be rescheduled.

## 2018-11-17 NOTE — Telephone Encounter (Signed)
Called the patient in regards to the missed appointment however the contact number was a non-working number. Mailing an missed appointment letter.

## 2018-11-19 NOTE — Telephone Encounter (Signed)
Opened in error

## 2018-11-25 ENCOUNTER — Encounter (HOSPITAL_COMMUNITY): Payer: Self-pay

## 2018-11-25 ENCOUNTER — Ambulatory Visit (HOSPITAL_COMMUNITY): Payer: Medicaid Other | Admitting: *Deleted

## 2018-11-25 ENCOUNTER — Other Ambulatory Visit (HOSPITAL_COMMUNITY): Payer: Self-pay | Admitting: *Deleted

## 2018-11-25 ENCOUNTER — Ambulatory Visit (HOSPITAL_COMMUNITY)
Admission: RE | Admit: 2018-11-25 | Discharge: 2018-11-25 | Disposition: A | Discharge status: Home / Self Care | Payer: Medicaid Other | Source: Ambulatory Visit | Attending: Obstetrics & Gynecology | Admitting: Obstetrics & Gynecology

## 2018-11-25 ENCOUNTER — Other Ambulatory Visit: Payer: Self-pay

## 2018-11-25 DIAGNOSIS — O99311 Alcohol use complicating pregnancy, first trimester: Secondary | ICD-10-CM

## 2018-11-25 DIAGNOSIS — Z363 Encounter for antenatal screening for malformations: Secondary | ICD-10-CM

## 2018-11-25 DIAGNOSIS — Z3480 Encounter for supervision of other normal pregnancy, unspecified trimester: Secondary | ICD-10-CM | POA: Diagnosis not present

## 2018-11-25 DIAGNOSIS — Z3A19 19 weeks gestation of pregnancy: Secondary | ICD-10-CM

## 2018-11-25 DIAGNOSIS — Z362 Encounter for other antenatal screening follow-up: Secondary | ICD-10-CM

## 2018-11-25 DIAGNOSIS — O99312 Alcohol use complicating pregnancy, second trimester: Secondary | ICD-10-CM

## 2018-12-23 ENCOUNTER — Other Ambulatory Visit: Payer: Self-pay

## 2018-12-23 ENCOUNTER — Ambulatory Visit (HOSPITAL_COMMUNITY): Payer: Medicaid Other | Admitting: *Deleted

## 2018-12-23 ENCOUNTER — Ambulatory Visit (HOSPITAL_COMMUNITY)
Admission: RE | Admit: 2018-12-23 | Discharge: 2018-12-23 | Disposition: A | Discharge status: Home / Self Care | Payer: Medicaid Other | Source: Ambulatory Visit | Attending: Obstetrics and Gynecology | Admitting: Obstetrics and Gynecology

## 2018-12-23 ENCOUNTER — Other Ambulatory Visit (HOSPITAL_COMMUNITY): Payer: Self-pay | Admitting: *Deleted

## 2018-12-23 ENCOUNTER — Encounter (HOSPITAL_COMMUNITY): Payer: Self-pay

## 2018-12-23 DIAGNOSIS — Z362 Encounter for other antenatal screening follow-up: Secondary | ICD-10-CM | POA: Diagnosis present

## 2018-12-23 DIAGNOSIS — O99310 Alcohol use complicating pregnancy, unspecified trimester: Secondary | ICD-10-CM

## 2018-12-23 DIAGNOSIS — O99311 Alcohol use complicating pregnancy, first trimester: Secondary | ICD-10-CM | POA: Diagnosis present

## 2018-12-23 DIAGNOSIS — Z3A23 23 weeks gestation of pregnancy: Secondary | ICD-10-CM | POA: Diagnosis not present

## 2018-12-23 DIAGNOSIS — O99312 Alcohol use complicating pregnancy, second trimester: Secondary | ICD-10-CM | POA: Diagnosis not present

## 2019-01-06 ENCOUNTER — Encounter: Payer: Self-pay | Admitting: Medical

## 2019-01-06 ENCOUNTER — Other Ambulatory Visit (HOSPITAL_COMMUNITY)
Admission: RE | Admit: 2019-01-06 | Discharge: 2019-01-06 | Disposition: A | Discharge status: Home / Self Care | Payer: Medicaid Other | Source: Ambulatory Visit | Attending: Medical | Admitting: Medical

## 2019-01-06 ENCOUNTER — Other Ambulatory Visit: Payer: Self-pay

## 2019-01-06 ENCOUNTER — Ambulatory Visit (INDEPENDENT_AMBULATORY_CARE_PROVIDER_SITE_OTHER): Payer: Medicaid Other | Admitting: Medical

## 2019-01-06 VITALS — BP 112/73 | HR 87 | Wt 185.5 lb

## 2019-01-06 DIAGNOSIS — O23592 Infection of other part of genital tract in pregnancy, second trimester: Secondary | ICD-10-CM

## 2019-01-06 DIAGNOSIS — O26892 Other specified pregnancy related conditions, second trimester: Secondary | ICD-10-CM | POA: Insufficient documentation

## 2019-01-06 DIAGNOSIS — A599 Trichomoniasis, unspecified: Secondary | ICD-10-CM

## 2019-01-06 DIAGNOSIS — N898 Other specified noninflammatory disorders of vagina: Secondary | ICD-10-CM

## 2019-01-06 DIAGNOSIS — O2602 Excessive weight gain in pregnancy, second trimester: Secondary | ICD-10-CM

## 2019-01-06 DIAGNOSIS — O99311 Alcohol use complicating pregnancy, first trimester: Secondary | ICD-10-CM

## 2019-01-06 DIAGNOSIS — B373 Candidiasis of vulva and vagina: Secondary | ICD-10-CM

## 2019-01-06 DIAGNOSIS — Z3480 Encounter for supervision of other normal pregnancy, unspecified trimester: Secondary | ICD-10-CM

## 2019-01-06 DIAGNOSIS — B3731 Acute candidiasis of vulva and vagina: Secondary | ICD-10-CM

## 2019-01-06 DIAGNOSIS — A5901 Trichomonal vulvovaginitis: Secondary | ICD-10-CM

## 2019-01-06 DIAGNOSIS — Z3A25 25 weeks gestation of pregnancy: Secondary | ICD-10-CM

## 2019-01-06 DIAGNOSIS — O99312 Alcohol use complicating pregnancy, second trimester: Secondary | ICD-10-CM

## 2019-01-06 DIAGNOSIS — O2342 Unspecified infection of urinary tract in pregnancy, second trimester: Secondary | ICD-10-CM

## 2019-01-06 DIAGNOSIS — O23591 Infection of other part of genital tract in pregnancy, first trimester: Secondary | ICD-10-CM

## 2019-01-06 DIAGNOSIS — O2341 Unspecified infection of urinary tract in pregnancy, first trimester: Secondary | ICD-10-CM

## 2019-01-06 MED ORDER — BLOOD PRESSURE KIT DEVI: 1.0000 | Freq: Every day | 0 refills | Status: DC

## 2019-01-06 MED ORDER — PREPLUS 27-1 MG PO TABS: 1.0000 | ORAL_TABLET | Freq: Every day | ORAL | 6 refills | Status: DC

## 2019-01-06 MED ORDER — METRONIDAZOLE 500 MG PO TABS: 2000.0000 mg | ORAL_TABLET | Freq: Once | ORAL | 0 refills | Status: AC

## 2019-01-06 MED ORDER — PRENATAL ADULT GUMMY/DHA/FA 0.4-25 MG PO CHEW: 2.0000 | CHEWABLE_TABLET | Freq: Every day | ORAL | 11 refills | Status: DC

## 2019-01-06 NOTE — Progress Notes (Signed)
   PRENATAL VISIT NOTE  Subjective:  Kristin Brock is a 23 y.o. G2P1001 at 108w4dbeing seen today for ongoing prenatal care.  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  Patient reports no complaints.  Contractions: Not present. Vag. Bleeding: None.  Movement: Present. Denies leaking of fluid.   The following portions of the patient's history were reviewed and updated as appropriate: allergies, current medications, past family history, past medical history, past social history, past surgical history and problem list.   Objective:   Vitals:   01/06/19 1352  BP: 112/73  Pulse: 87  Weight: 185 lb 8 oz (84.1 kg)    Fetal Status: Fetal Heart Rate (bpm): 154   Movement: Present     General:  Alert, oriented and cooperative. Patient is in no acute distress.  Skin: Skin is warm and dry. No rash noted.   Cardiovascular: Normal heart rate noted  Respiratory: Normal respiratory effort, no problems with respiration noted  Abdomen: Soft, gravid, appropriate for gestational age.  Pain/Pressure: Present     Pelvic: Cervical exam deferred        Extremities: Normal range of motion.  Edema: Trace  Mental Status: Normal mood and affect. Normal behavior. Normal judgment and thought content.   Assessment and Plan:  Pregnancy: G2P1001 at 235w4d. Vaginal discharge during pregnancy in second trimester - Cervicovaginal ancillary only( [place] 2. Trichomonas infection - Patient was never treated stating she could not afford her medication, I have resent her Rx today and encouraged partner treatment and abstinence x 2 weeks - metroNIDAZOLE (FLAGYL) 500 MG tablet;  Take 4 tablets (2,000 mg total) by mouth once for 1 dose.  Dispense: 4 tablet; Refill: 0  3. Supervision of other normal pregnancy, antepartum - Blood Pressure Monitoring (BLOOD PRESSURE KIT) DEVI; 1 Device by Does not apply route daily. ICD 10: Z34.00  Dispense: 1 Device; Refill: 0 - Prenatal MV & Min w/FA-DHA (PRENATAL ADULT GUMMY/DHA/FA) 0.4-25 MG CHEW; Chew 2 tablets by mouth daily.  Dispense: 60 tablet; Refill: 11  4. 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 UTI (urinary tract infection) in pregnancy in first trimester - TOC urine culture sent   6. Excessive weight gain during pregnancy in second trimester - 53 # weight gain this pregnancy, encouraged healthy food choices   Preterm labor symptoms and general obstetric precautions including but not limited to vaginal bleeding, contractions, leaking of fluid and fetal movement were reviewed in detail with the patient. Please refer to After Visit Summary for other counseling recommendations.   Return in about 3 weeks (around 01/27/2019) for 28 week labs (fasting), In-Person, HOB.  Future Appointments  Date Time Provider [place]11/02/2019  2:15 PM [place]  01/28/2019  2:15 PM [place] WH-MFCUS MFC-US    JuKerry HoughPA-C

## 2019-01-06 NOTE — Patient Instructions (Addendum)
Places to have your son circumcised:                                                                      [facility]     [phone] while you are in hospital         [facility]              [phone]   $269 by 4 wks                      [facility]                     [phone]   $269 by 7 days [facility]                    [phone]   $269 by 4 wks [facility]             [phone]   $225 by 2 wks    These prices sometimes change but are roughly what you can expect to pay. Please call and confirm pricing.   Circumcision is considered an elective/non-medically necessary procedure. There are many reasons parents decide to have their sons circumsized. During the first year of life circumcised males have a reduced risk of urinary tract infections but after this year the rates between circumcised males and uncircumcised males are the same.  It is safe to have your son circumcised outside of the hospital and the places above perform them regularly.   Deciding about Circumcision in Baby Boys  (Up-to-date The Basics)  What is circumcision?   Circumcision is a surgery that removes the skin that covers the tip of the penis, called the "foreskin" Circumcision is usually done when a boy is between 75 and 66 days old. In the [place], circumcision is common. In some other countries, fewer boys are circumcised. Circumcision is a common tradition in some religions.  Should I have my baby boy circumcised?   There is no easy answer. Circumcision has some benefits. But it also has risks. After talking with your doctor, you will have to decide for yourself what is right for your family.  What are the benefits of circumcision?   Circumcised boys seem to have slightly lower rates of: ?Urinary tract infections ?Swelling of the opening at the tip of the penis Circumcised men seem to have slightly lower rates of: ?Urinary tract infections ?Swelling of the opening at the tip of the penis ?Penis cancer ?HIV  and other infections that you catch during sex ?Cervical cancer in the women they have sex with Even so, in the [place], the risks of these problems are small - even in boys and men who have not been circumcised. Plus, boys and men who are not circumcised can reduce these extra risks by: ?Cleaning their penis well ?Using condoms during sex  What are the risks of circumcision?  Risks include: ?Bleeding or infection from the surgery ?Damage to or amputation of the penis ?A chance that the doctor will cut off too much or not enough of the foreskin ?A chance that sex won't feel as good later in life Only about 1 out of every 200  circumcisions leads to problems. There is also a chance that your health insurance won't pay for circumcision.  How is circumcision done in baby boys?  First, the baby gets medicine for pain relief. This might be a cream on the skin or a shot into the base of the penis. Next, the doctor cleans the baby's penis well. Then he or she uses special tools to cut off the foreskin. Finally, the doctor wraps a bandage (called gauze) around the baby's penis. If you have your baby circumcised, his doctor or nurse will give you instructions on how to care for him after the surgery. It is important that you follow those instructions carefully. AREA PEDIATRIC/FAMILY PRACTICE PHYSICIANS  [place] ([address]) .  [facility] o Chambliss, MD; Eniola, MD; Hale, MD; Hensel, MD; McDiarmid, MD; McIntyer, MD; Neal, MD; Walden, MD o [address] [place], [place] [address] o [phone] o Mon-Fri 8:30-12:30, 1:30-5:00 o Providers come to see babies at [facility] o Accepting Medicaid . [facility] o Limited providers who accept newborns: Koirala, MD; Morrow, MD; Wolters, MD o [address], [place], [place] [address] o [phone] o Mon-Fri 8:00-5:30 o Babies seen by providers at [facility] o Does NOT accept Medicaid o Please call early in hospitalization for appointment (limited availability)  . [facility] o Mulberry, MD o [address] [place], [place] [address] o [phone] o Mon, Tue, Thur, Fri 8:30-5:00, Wed 10:00-7:00 (closed 1-2pm) o Babies seen by [facility] providers o Accepting Medicaid . [facility] - Pediatrician o Rubin, MD o [address], [place], [place] [address] o [phone] o Mon-Fri 8:30-5:00, Sat 8:30-12:00 o Provider comes to see babies at [facility] o Accepting Medicaid o Must have been referred from current patients or contacted office prior to delivery . [facility] ([facility]) o Brown, MD; Chandler, MD; Ettefagh, MD; Grant, MD; Lester, MD; McCormick, MD; McQueen, MD; Prose, MD; Simha, MD; Stanley, MD; Stryffeler, NP; Tebben, NP o [address], [place], [place] [address] o [phone] o Mon, Tue, Thur, Fri 8:30-5:30, Wed 9:30-5:30, Sat 8:30-12:30 o Babies seen by [facility] providers o Accepting Medicaid o Only accepting infants of first-time parents or siblings of current patients o Hospital discharge coordinator will make follow-up appointment . Jack Amos o [address], [place], [place]  [address] o [phone]   Fax - [phone] . [facility] o [address], [place], [place]  [address] o Phone - [phone]   Fax - [phone] . Shilpa Gosrani o [address], [place], [place]  [address] o [phone]  [place] ([address]) . [facility] o Bates, MD; Brassfield, MD; Cooper, Cox, MD; MD; Davis, MD; Dovico, MD; Ettefaugh, MD; Little, MD; Lowe, MD; Keiffer, MD; Melvin, MD; Sumner, MD; Williams, MD o [address], [place], [place] [address] o [phone] o Mon-Fri 8:30-5:00 (extended evenings Mon-Thur as needed), Sat-Sun 10:00-1:00 o Providers  come to see babies at [facility] o Accepting Medicaid for families of first-time babies and families with all children in the household age 3 and under. Must register with office prior to making appointment (M-F only). . [facility] o Henson, NP; Knapp, MD; Lalonde, MD; Tysinger, PA o [address] [place], [place] [address] o [phone] o Mon-Fri 8:00-5:00 o Babies seen by providers at [facility] o Does NOT accept   Medicaid/Commercial Insurance Only . [facility] ([facility])  o Artis, MD; Barnes, MD; Bratton, MD; Coccaro, MD; Lockett Gardner, MD; Kramer, MD; Marshall, MD; Netherton, MD; Poleto, MD; Skinner, MD o [address] [place], [place] [address] o [phone] o Mon-Fri 8:30-5:30, Sat (Oct.-Mar.) 9:00-1:00 o Babies seen by providers at [facility] o Accepting Medicaid  [place] ([address]) . [facility] o Reid, MD; Warner, MD o [address], [place], [place] [address] o [phone] o Mon-Fri 8:30-5:00, Sat 8:30-12:00 o Providers come to see babies at [facility] o Does NOT accept Medicaid . [facility] o Becker, PA; Hagler, MD; Scifres, PA; Sun, MD; Swayne, MD o [address], [place], [place] [address] o [phone] o Mon-Fri 8:00-5:00 o Babies seen by providers at [facility] o Does NOT accept Medicaid o Only accepting babies of parents who are patients o Please call early in hospitalization for appointment (limited availability) . [facility] o Clark, MD; Frye, MD; Kelleher, MD; Mack, NP; Miller, MD; O'Keller, MD; Patterson, NP; Pudlo, MD; Puzio, MD; Thomas, MD; Tucker, MD; Twiselton, MD o [address], [place], [place] [address] o [phone] o Mon-Fri 8:00-5:00, Sat 9:00-12:00 o Providers come to see babies at [facility] o Does NOT accept Medicaid  [place] ([address]) . [facility] o Limited providers accepting new patients: Brake, NP; Wharton, PA o [address], [place], [place] [address] o [phone] o Mon-Fri 8:00-5:00 o Babies seen by providers at [facility] o Does NOT accept Medicaid o Only accepting babies of parents who are patients o Please call early in hospitalization for appointment (limited availability) . [facility] o Gay, MD; Quinlan, MD o [address] [place], [place] [address] o [phone] (press 1 to schedule appointment) o Mon-Fri 8:00-5:00 o Providers come to see babies at [facility] o Does NOT accept Medicaid . [facility] o Mazer, MD o [address] [place], [place] [address] o [phone] o Mon-Fri 8:30-5:00 (lunch 12:30-1:00), extended hours by appointment only Wed 5:00-6:30 o Babies seen by [facility] providers o Accepting Medicaid . [facility] o Banks, MD; Jordan, MD; Koberlein, MD o [address], [place], [place] [address] o [phone] o Mon-Fri 8:00-5:00 o Babies seen by [facility] providers o Does NOT accept Medicaid . [facility] o Parker, MD; Hunter, MD; Wallace, DO o [address] [place], [place] [address] o [phone] o Mon-Fri 8:00-5:00 o Babies seen by [facility] providers o Does NOT accept Medicaid . [facility] o Brandon, PA; Brecken, PA; Christy, NP; Dees, MD; DeClaire, MD; DeWeese, MD; Hansen, NP; Mills, NP; Parrish, NP; Smoot, NP; Summer, MD; Vapne, MD o [address] [place], [place] [address] o [phone] o Mon-Fri 8:30-5:00, Sat 10:00-1:00 o Providers come to see babies at [facility] o Does NOT accept Medicaid o Free prenatal information session Tuesdays at 4:45pm . [facility] o Bouska, MD; Gordon, PA; Jeffery, PA; Weber, PA o [address] [place]  [address] o [phone] o Mon-Fri 7:30-5:30 o Babies seen by [facility] providers . [facility] o [address], [place], [place]  [address] o [phone]   Fax - [phone]  North  ([address] & [address]) . [facility] o [place]ese, [place] o [address] , [place] [address] o [phone] o Mon-Thur   8:00-6:00 o Providers come to see babies at [facility] o Accepting Medicaid . [facility] o Anderson, NP; Badger, MD; Beal, PA; Spencer, PA o [address] [place], [place] [address] o [phone] o Mon-Thur 7:30-7:30, Fri 7:30-4:30 o Babies seen by [facility] providers o Accepting Medicaid . [facility] o Agbuya, MD; Klett, NP; Romgoolam, MD o [address], [place], [place] [address] o [phone] o Mon-Fri 8:30-5:00, Sat 8:30-12:00 o Providers come to see babies at [facility] o Accepting Medicaid o Must have "Meet & Greet" appointment at office prior to delivery . [facility] ([facility] o McCord, MD; Wallace, MD; Wood, MD o [address], [place], [place] [address] o [phone] o Mon-Wed 8:00-6:00, Thur-Fri 8:00-5:00, Sat 9:00-12:00 o Providers come to see babies at [facility] o Does NOT accept Medicaid o Only accepting siblings of current patients . [facility]  o [address], [place], [place]  [address] o [phone]   Fax - [phone] . [facility] o [address], [place], [place]  [address] o [phone]   Fax - [phone]  [place] ([address] & [address]) . [facility] o Cirigliano, DO; Matthews, DO o [address] [place], [place] [address] o [phone] o Mon-Fri 7:00-5:00 o Babies seen by [facility] providers o Does NOT accept  Medicaid . [facility] o Briscoe, MD; Howley, PA; Moreira, PA o [address], [place], [place] [address] o [phone] o Mon-Fri 8:00-5:00 o Babies seen by [facility] providers o Accepting Medicaid . [facility] o Boyd, MD; Church, PA; Jones, NP; Osborn, PA o [address], , [place] [address] o [phone] o Mon-Fri 8:00-5:00 o Babies seen by providers at [facility] o Accepting Medicaid  [place] ([address]) . [facility] [place] o Wendling, DO o [address] [place], [place] [address] o [phone] o Mon-Fri 8:00-5:00 o Babies seen by [facility] providers o Does NOT accept Medicaid o Limited availability, please call early in hospitalization to schedule follow-up . [facility] o Calderon, PA; Cummings, MD; Dillard, MD; Martin, PA; Olson, MD; VanDeven, PA o [address], [place], [place] [address] o [phone] o Mon-Fri 8:30-5:00, Sat 9:00-12:00 o Babies seen by providers at [facility] o Accepting Medicaid o Please register online then schedule online or call office o [email or web address] . [facility] ([facility] o Hunter, NP; Kumar, MD; Martin [place], PA o [address], [place], [place] [address] o [phone] o Mon-Fri 8:00-5:00 o Babies seen by providers at [facility] o Accepting Medicaid . [facility] ([facility]) o Hartshorne, MD; Kristi Fleenor, NP; West, MD o [address], [place], [place] [address] o [phone] o Mon-Fri 8:00-5:30, Sat&Sun by appointment (phones open at 8:30) o Babies seen by [facility] providers o Accepting Medicaid o Must be a first-time baby or sibling of current patient . [facility] [place]  o [address], [place], [place]  [address] o [phone]   Fax - [phone]  [place] ([address]) . [facility] o Brown, PA; Cowen, PA; Rice, MD; Helton, PA; Spry, MD o [address] [place], [place] [address] o [phone] o Mon-Thur 8:00-7:00, Fri 8:00-5:00, Sat 8:00-12:00, Sun 9:00-12:00 o [facility] o Accepting Medicaid . [facility] o Coe-Goins, MD; Gaynell FaceMarshall, MD; [facility] Rehabilitation[facility]re-Louis, MD o [address], [place], [place] [address] o [phone] o Mon-Thur 8:00-5:00 o Babies [facility] at [facility] o Accepting Medicaid . [facility] [place] Gwenlyn Sarano Bratton, MD; Coe-Goins, MD; Madilyn FiremanHayes, MD; Melvyn NethLewis, MD; List, MD; Lazarus SalinesLott, MD; Gaynell FaceMarshall, MD; Berneda RoseMoran, MD; Flora Lipps'Neal, MD; Beryl MeagerPierre-Louis, MD; Luther RedoPitonzo, MD; Lavonia DraftsScholer, MD; Kellie SimmeringSpangle, MD o [address] [place], [place] [address] o [phone] o Mon-Fri 8:00-5:30, Sat (Oct.-Mar.) 9:00-1:00 o Bab[facility] at [facility] o Accepting Medicaid o Must fill out new patient packet, available online at [email or web address] . [facility]) Simone Curiao Friddle, NP; Tiburcio PeaHarris, NP; Tresa EndoKelly, NP; Whitney PostLogan, MD; [place], [place]; Hennie DuosPoth, MD; Wynne Dustamadoss, MD; Kavin LeechStanton, NP o [address], [place], [place] [address] o [phone] o Mon-Thur 8:00-5:30, Fri 8:00-5:00 o Babies seen by [facility] at [facility] o Accepting [facility]  [place] [phone]([phone]) . [facility] o Perdido BeachDixon, [place]; [place] City[place]ham, MD; Tanya NonesPickard, MD; [place], [place] o [address], [place], [place] [address] o [phone] o Mon-Fri 8:00-5:00 o Babies seen by providers at [facility] o Accepting [facility]   [place] [phone]) . [facility] o ReddickMasneri, DO; Lenise ArenaMeyers, MD; Wa[place]elson, [place] o [address], [place], [place]  [address] o [phone] o Mon-Fri 8:00-5:00 o Babies seen by providers at [facility] o Does NOT accept Medicaid o Limited appointment availability, please call early in hospitalization  . [facility] o MierKunedd, DO; Freedom PlainsMcGowen, MD o [address], [place], [place] [address] o [phone] o Mon-Fri 8:00-5:00 o Babies seen by [facility] providers o Does NOT accept Medicaid . [facility] Lorrine Kino Cameron, MD; Ninetta LightsMacDonald, MD; Catalpa CanyonMichaels, [place]; DuchesneNayak, MD o [address], [place], [place] [address] o [phone] o Mon-Fri 8:00-5:00 o After hours clinic [facility]([address] [place], [place] [address]) [phone] Mon-Fri 5:00-8:00, Sat 12:00-6:00, Sun 10:00-4:00 o Babies seen by [facility] providers o Accepting Medicaid . [facility] o [address], [place], [place]  [address] o [phone]   Fax - [phone]  [place] [phone]) . [facility] conservation [facility] at [facility] o Andy, MD o [address] [place], [place], [place] [address] o [phone] o Mon-Fri 8:00-5:00 o Babies seen by [facility] providers o Does NOT accept Medicaid . [facility] [place] [facility] [place]) Tomi Likenso Eksir, MD o [address], [place], [place] [address] o [phone] o Mon-Thur 8:00-7:00, Fri 8:00-5:00, Sat 8:00-12:00 o Babies seen by providers at [facility] o Accepting Medicaid - but does not have vaccinations in office (must be received elsewhere) o Limited availability, please call early in hospitalization  [place] ([address]) .  Pediatrics  o Wyvonne Lenzharlene Flemming, MD o [address], [place] [address] o [phone]  Fax [phone]   Second Trimester of Pregnancy  The second trimester is from week 14 through week 27 (month 4 through 6). This is often the time in pregnancy that you feel your best. Often times, morning sickness has  lessened or quit. You may have more energy,  and you may get hungry more often. Your unborn baby is growing rapidly. At the end of the sixth month, he or she is about 9 inches long and weighs about 1 pounds. You will likely feel the baby move between 18 and 20 weeks of pregnancy. Follow these instructions at home: Medicines  Take over-the-counter and prescription medicines only as told by your doctor. Some medicines are safe and some medicines are not safe during pregnancy.  Take a prenatal vitamin that contains at least 600 micrograms (mcg) of folic acid.  If you have trouble pooping (constipation), take medicine that will make your stool soft (stool softener) if your doctor approves. Eating and drinking   Eat regular, healthy meals.  Avoid raw meat and uncooked cheese.  If you get low calcium from the food you eat, talk to your doctor about taking a daily calcium supplement.  Avoid foods that are high in fat and sugars, such as fried and sweet foods.  If you feel sick to your stomach (nauseous) or throw up (vomit): ? Eat 4 or 5 small meals a day instead of 3 large meals. ? Try eating a few soda crackers. ? Drink liquids between meals instead of during meals.  To prevent constipation: ? Eat foods that are high in fiber, like fresh fruits and vegetables, whole grains, and beans. ? Drink enough fluids to keep your pee (urine) clear or pale yellow. Activity  Exercise only as told by your doctor. Stop exercising if you start to have cramps.  Do not exercise if it is too hot, too humid, or if you are in a place of great height (high altitude).  Avoid heavy lifting.  Wear low-heeled shoes. Sit and stand up straight.  You can continue to have sex unless your doctor tells you not to. Relieving pain and discomfort  Wear a good support bra if your breasts are tender.  Take warm water baths (sitz baths) to soothe pain or discomfort caused by hemorrhoids. Use hemorrhoid cream if your  doctor approves.  Rest with your legs raised if you have leg cramps or low back pain.  If you develop puffy, bulging veins (varicose veins) in your legs: ? Wear support hose or compression stockings as told by your doctor. ? Raise (elevate) your feet for 15 minutes, 3-4 times a day. ? Limit salt in your food. Prenatal care  Write down your questions. Take them to your prenatal visits.  Keep all your prenatal visits as told by your doctor. This is important. Safety  Wear your seat belt when driving.  Make a list of emergency phone numbers, including numbers for family, friends, the hospital, and police and fire departments. General instructions  Ask your doctor about the right foods to eat or for help finding a counselor, if you need these services.  Ask your doctor about local prenatal classes. Begin classes before month 6 of your pregnancy.  Do not use hot tubs, steam rooms, or saunas.  Do not douche or use tampons or scented sanitary pads.  Do not cross your legs for long periods of time.  Visit your dentist if you have not done so. Use a soft toothbrush to brush your teeth. Floss gently.  Avoid all smoking, herbs, and alcohol. Avoid drugs that are not approved by your doctor.  Do not use any products that contain nicotine or tobacco, such as cigarettes and e-cigarettes. If you need help quitting, ask your doctor.  Avoid cat litter boxes and soil used by  cats. These carry germs that can cause birth defects in the baby and can cause a loss of your baby (miscarriage) or stillbirth. Contact a doctor if:  You have mild cramps or pressure in your lower belly.  You have pain when you pee (urinate).  You have bad smelling fluid coming from your vagina.  You continue to feel sick to your stomach (nauseous), throw up (vomit), or have watery poop (diarrhea).  You have a nagging pain in your belly area.  You feel dizzy. Get help right away if:  You have a fever.  You are  leaking fluid from your vagina.  You have spotting or bleeding from your vagina.  You have severe belly cramping or pain.  You lose or gain weight rapidly.  You have trouble catching your breath and have chest pain.  You notice sudden or extreme puffiness (swelling) of your face, hands, ankles, feet, or legs.  You have not felt the baby move in over an hour.  You have severe headaches that do not go away when you take medicine.  You have trouble seeing. Summary  The second trimester is from week 14 through week 27 (months 4 through 6). This is often the time in pregnancy that you feel your best.  To take care of yourself and your unborn baby, you will need to eat healthy meals, take medicines only if your doctor tells you to do so, and do activities that are safe for you and your baby.  Call your doctor if you get sick or if you notice anything unusual about your pregnancy. Also, call your doctor if you need help with the right food to eat, or if you want to know what activities are safe for you. This information is not intended to replace advice given to you by your health care provider. Make sure you discuss any questions you have with your health care provider. Document Released: 05/29/2009 Document Revised: 06/26/2018 Document Reviewed: 04/09/2016 Elsevier Patient Education  2020 [employer].

## 2019-01-06 NOTE — Addendum Note (Signed)
Addended by: Michel Harrow on: 01/06/2019 02:32 PM   Modules accepted: Orders

## 2019-01-07 ENCOUNTER — Telehealth: Payer: Self-pay | Admitting: *Deleted

## 2019-01-07 DIAGNOSIS — A599 Trichomoniasis, unspecified: Secondary | ICD-10-CM

## 2019-01-07 DIAGNOSIS — Z3480 Encounter for supervision of other normal pregnancy, unspecified trimester: Secondary | ICD-10-CM

## 2019-01-07 MED ORDER — METRONIDAZOLE 500 MG PO TABS: 500.0000 mg | ORAL_TABLET | Freq: Once | ORAL | 0 refills | Status: AC

## 2019-01-07 NOTE — Telephone Encounter (Signed)
Henley called and left a message she wanted to know where all her prescriptions were sent yesterday.  I called Tamsen and she states she found on MyChart that her blood pressure cuff and PNV was sent to [facility] and she is there now and wants to know where other rx was sent. States she removed [facility] from her pharmacy list and wants all prescriptions sent to [facility]. I informed her I will resend the Flagyl and update her pharmacy.  She voices understanding.  Linda,RN

## 2019-01-09 LAB — URINE CULTURE

## 2019-01-11 MED ORDER — CEPHALEXIN 500 MG PO CAPS: 500.0000 mg | ORAL_CAPSULE | Freq: Four times a day (QID) | ORAL | 0 refills | Status: DC

## 2019-01-11 NOTE — Addendum Note (Signed)
Addended by: Luvenia Redden on: 01/11/2019 02:28 PM   Modules accepted: Orders

## 2019-01-14 LAB — CERVICOVAGINAL ANCILLARY ONLY
Bacterial Vaginitis (gardnerella): NEGATIVE
Candida Glabrata: NEGATIVE
Candida Vaginitis: POSITIVE — AB
Chlamydia: NEGATIVE
Comment: NEGATIVE
Comment: NEGATIVE
Comment: NEGATIVE
Comment: NEGATIVE
Comment: NEGATIVE
Comment: NORMAL
Neisseria Gonorrhea: NEGATIVE
Trichomonas: POSITIVE — AB

## 2019-01-14 MED ORDER — TERCONAZOLE 0.8 % VA CREA: 1.0000 | TOPICAL_CREAM | Freq: Every day | VAGINAL | 0 refills | Status: DC

## 2019-01-14 NOTE — Addendum Note (Signed)
Addended by: Luvenia Redden on: 01/14/2019 12:14 PM   Modules accepted: Orders

## 2019-01-21 ENCOUNTER — Other Ambulatory Visit: Payer: Self-pay | Admitting: Lactation Services

## 2019-01-21 DIAGNOSIS — Z3480 Encounter for supervision of other normal pregnancy, unspecified trimester: Secondary | ICD-10-CM

## 2019-01-27 ENCOUNTER — Other Ambulatory Visit: Payer: Medicaid Other

## 2019-01-27 ENCOUNTER — Encounter: Payer: Medicaid Other | Admitting: Medical

## 2019-01-28 ENCOUNTER — Ambulatory Visit (HOSPITAL_COMMUNITY): Payer: Medicaid Other

## 2019-01-28 ENCOUNTER — Ambulatory Visit (HOSPITAL_COMMUNITY)
Admission: RE | Admit: 2019-01-28 | Discharge: 2019-01-28 | Disposition: A | Discharge status: Home / Self Care | Payer: Medicaid Other | Source: Ambulatory Visit | Attending: Obstetrics and Gynecology | Admitting: Obstetrics and Gynecology

## 2019-01-28 ENCOUNTER — Encounter (HOSPITAL_COMMUNITY): Payer: Self-pay

## 2019-02-01 ENCOUNTER — Telehealth: Payer: Self-pay | Admitting: Medical

## 2019-02-01 NOTE — Telephone Encounter (Signed)
Spoke to patient about her appointment on 11/18 @ 8:35. Patient instructed to wear a face mask for the entire appointment and no visitors are allowed with her during the visit. Patient screened for covid symptoms and denied having any. Patient instructed to come fasting.

## 2019-02-03 ENCOUNTER — Other Ambulatory Visit: Payer: Medicaid Other

## 2019-02-03 ENCOUNTER — Ambulatory Visit (INDEPENDENT_AMBULATORY_CARE_PROVIDER_SITE_OTHER): Payer: Medicaid Other | Admitting: Student

## 2019-02-03 ENCOUNTER — Other Ambulatory Visit: Payer: Self-pay

## 2019-02-03 VITALS — BP 121/75 | HR 99 | Wt 188.7 lb

## 2019-02-03 DIAGNOSIS — Z3A29 29 weeks gestation of pregnancy: Secondary | ICD-10-CM

## 2019-02-03 DIAGNOSIS — Z3480 Encounter for supervision of other normal pregnancy, unspecified trimester: Secondary | ICD-10-CM

## 2019-02-03 DIAGNOSIS — Z23 Encounter for immunization: Secondary | ICD-10-CM

## 2019-02-03 DIAGNOSIS — A5901 Trichomonal vulvovaginitis: Secondary | ICD-10-CM

## 2019-02-03 DIAGNOSIS — O23593 Infection of other part of genital tract in pregnancy, third trimester: Secondary | ICD-10-CM

## 2019-02-03 DIAGNOSIS — O2341 Unspecified infection of urinary tract in pregnancy, first trimester: Secondary | ICD-10-CM

## 2019-02-03 DIAGNOSIS — F419 Anxiety disorder, unspecified: Secondary | ICD-10-CM

## 2019-02-03 DIAGNOSIS — Z3483 Encounter for supervision of other normal pregnancy, third trimester: Secondary | ICD-10-CM

## 2019-02-03 DIAGNOSIS — O2343 Unspecified infection of urinary tract in pregnancy, third trimester: Secondary | ICD-10-CM

## 2019-02-03 MED ORDER — CEPHALEXIN 500 MG PO CAPS: 500.0000 mg | ORAL_CAPSULE | Freq: Four times a day (QID) | ORAL | 0 refills | Status: DC

## 2019-02-03 MED ORDER — METRONIDAZOLE 500 MG PO TABS: 2000.0000 mg | ORAL_TABLET | Freq: Once | ORAL | 0 refills | Status: AC

## 2019-02-03 NOTE — Patient Instructions (Addendum)
-  you will have a nurse visit this week to learn how to take your BP. If you take your blood and its greater than 140/90 on two occasions; come to [facility] for evaluation  -take your medicine for trich and UTI. Both of those prescriptions are at the pharmacy.

## 2019-02-03 NOTE — Progress Notes (Signed)
   PRENATAL VISIT NOTE  Subjective:  Kristin Brock is a 23 y.o. G2P1001 at [redacted]w[redacted]d being seen today for ongoing prenatal care.  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  Patient reports no complaints.She has not picked up her antibiotics or her medicine for trich. She reports feeling lightheaded and hot at times; thinks it could be anxiety.   Contractions: Not present. Vag. Bleeding: None.  Movement: Present. Denies leaking of fluid.   The following portions of the patient's history were reviewed and updated as appropriate: allergies, current medications, past family history, past medical history, past social history, past surgical history and problem list.   Objective:   Vitals:   02/03/19 0848  BP: 121/75  Pulse: 99  Weight: 188 lb 11.2 oz (85.6 kg)    Fetal Status: Fetal Heart Rate (bpm): 157 Fundal Height: 31 cm Movement: Present     General:  Alert, oriented and cooperative. Patient is in no acute distress.  Skin: Skin is warm and dry. No rash noted.   Cardiovascular: Normal heart rate noted  Respiratory: Normal respiratory effort, no problems with respiration noted  Abdomen: Soft, gravid, appropriate for gestational age.  Pain/Pressure: Present     Pelvic: Cervical exam deferred        Extremities: Normal range of motion.  Edema: None  Mental Status: Normal mood and affect. Normal behavior. Normal judgment and thought content.   Assessment and Plan:  Pregnancy: G2P1001 at [redacted]w[redacted]d 1. Trichomonal vaginitis during pregnancy in first trimester -resent RX for Trich  2. UTI (urinary tract infection) in pregnancy in first trimester -resent RX for  Keflex.   3. Supervision of other normal pregnancy, antepartum  -patient does not know how to use her blood pressure cuff so appt made for nurse visit to show her how to use it. Reviewed the warning signs and when to come to [facility].  -discussed birth control; she is interested in PP IUD.  -patient to have follow up with Kristin Brock.   Preterm labor symptoms and general obstetric precautions including but not limited to vaginal bleeding, contractions, leaking of fluid and fetal movement were reviewed in detail with the patient. Please refer to After Visit Summary for other counseling recommendations.   Return in about 4 weeks (around 03/03/2019), or nurse visit this week to show how to use BP cuff and jamie , then LROB on Mychart in 4 weeks.  Future Appointments  Date Time Provider [place]  02/03/2019  9:30 AM [facility] [place], [place]

## 2019-02-04 LAB — GLUCOSE TOLERANCE, 2 HOURS W/ 1HR
Glucose, 1 hour: 95 mg/dL (ref 65–179)
Glucose, 2 hour: 74 mg/dL (ref 65–152)
Glucose, Fasting: 88 mg/dL (ref 65–91)

## 2019-02-04 LAB — CBC
Hematocrit: 29.9 % — ABNORMAL LOW (ref 34.0–46.6)
Hemoglobin: 10.6 g/dL — ABNORMAL LOW (ref 11.1–15.9)
MCH: 31.9 pg (ref 26.6–33.0)
MCHC: 35.5 g/dL (ref 31.5–35.7)
MCV: 90 fL (ref 79–97)
Platelets: 205 10*3/uL (ref 150–450)
RBC: 3.32 x10E6/uL — ABNORMAL LOW (ref 3.77–5.28)
RDW: 11.2 % — ABNORMAL LOW (ref 11.7–15.4)
WBC: 15.2 10*3/uL — ABNORMAL HIGH (ref 3.4–10.8)

## 2019-02-04 LAB — HIV ANTIBODY (ROUTINE TESTING W REFLEX): HIV Screen 4th Generation wRfx: NONREACTIVE

## 2019-02-04 LAB — RPR: RPR Ser Ql: NONREACTIVE

## 2019-02-05 NOTE — BH Specialist Note (Signed)
Integrated Behavioral Health via Telemedicine Video Visit  Pt forgot about this visit and prefers to reschedule for December 2 at 3:45pm.   02/05/2019 Kristin Brock [id number]  Number of [place] visits: 1 Session Start time: 9:16  Session End time: 9:21 Total time: 5 minutes  Referring Provider: Maye Hides, CNM Type of Visit: Video Patient/Family location: Home [facility] Provider location: [facility] All persons participating in visit: Patient Chenay Nesmith and [place]

## 2019-02-08 ENCOUNTER — Ambulatory Visit: Payer: Medicaid Other | Admitting: Clinical

## 2019-02-08 ENCOUNTER — Ambulatory Visit (INDEPENDENT_AMBULATORY_CARE_PROVIDER_SITE_OTHER): Payer: Medicaid Other

## 2019-02-08 ENCOUNTER — Other Ambulatory Visit: Payer: Self-pay

## 2019-02-08 DIAGNOSIS — Z719 Counseling, unspecified: Secondary | ICD-10-CM

## 2019-02-08 NOTE — Progress Notes (Signed)
Pt here today for BP teaching on properly using a cuff.  Pt explained how to obtain BP reading, making sure that legs are not crossed, sitting position.  Pt verbalized understanding with no further questions.   Mel Almond, RN 02/08/19

## 2019-02-08 NOTE — Progress Notes (Signed)
Patient seen and assessed by nursing staff during this encounter. I have reviewed the chart and agree with the documentation and plan.  Kerry Hough, PA-C 02/08/2019 3:20 PM

## 2019-02-15 NOTE — BH Specialist Note (Signed)
Pt did not arrive to video visit and did not answer the phone ; Left HIPPA-compliant message to call back Roselyn Reef from [facility] at [phone].  ; left MyChart message for patient.    [place] via Telemedicine Video Visit  02/15/2019 Stephonie Wilcoxen [id number]  [place]

## 2019-02-17 ENCOUNTER — Other Ambulatory Visit: Payer: Self-pay

## 2019-02-17 ENCOUNTER — Ambulatory Visit: Payer: Medicaid Other | Admitting: Clinical

## 2019-02-17 DIAGNOSIS — Z91199 Patient's noncompliance with other medical treatment and regimen due to unspecified reason: Secondary | ICD-10-CM

## 2019-02-17 DIAGNOSIS — Z5329 Procedure and treatment not carried out because of patient's decision for other reasons: Secondary | ICD-10-CM

## 2019-03-03 ENCOUNTER — Telehealth (INDEPENDENT_AMBULATORY_CARE_PROVIDER_SITE_OTHER): Payer: Medicaid Other | Admitting: Medical

## 2019-03-03 ENCOUNTER — Encounter: Payer: Self-pay | Admitting: Medical

## 2019-03-03 ENCOUNTER — Other Ambulatory Visit: Payer: Self-pay

## 2019-03-03 VITALS — BP 118/73 | HR 87

## 2019-03-03 DIAGNOSIS — O2603 Excessive weight gain in pregnancy, third trimester: Secondary | ICD-10-CM

## 2019-03-03 DIAGNOSIS — Z3A33 33 weeks gestation of pregnancy: Secondary | ICD-10-CM

## 2019-03-03 DIAGNOSIS — Z3480 Encounter for supervision of other normal pregnancy, unspecified trimester: Secondary | ICD-10-CM

## 2019-03-03 DIAGNOSIS — O99313 Alcohol use complicating pregnancy, third trimester: Secondary | ICD-10-CM

## 2019-03-03 DIAGNOSIS — O99311 Alcohol use complicating pregnancy, first trimester: Secondary | ICD-10-CM

## 2019-03-03 NOTE — Progress Notes (Signed)
I connected with Kristin Brock on 03/03/19 at 10:55 AM EST by: MyChart and verified that I am speaking with the correct person using two identifiers.  Patient is located at home and provider is located at [facility].     The purpose of this virtual visit is to provide medical care while limiting exposure to the novel coronavirus. I discussed the limitations, risks, security and privacy concerns of performing an evaluation and management service by MyChart and the availability of in person appointments. I also discussed with the patient that there may be a patient responsible charge related to this service. By engaging in this virtual visit, you consent to the provision of healthcare.  Additionally, you authorize for your insurance to be billed for the services provided during this visit.  The patient expressed understanding and agreed to proceed.  The following staff members participated in the virtual visit:  Louisa Second, RN    PRENATAL VISIT NOTE  Subjective:  Kristin Brock is a 23 y.o. G2P1001 at [redacted]w[redacted]d  for phone visit for ongoing prenatal care.  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  Patient reports occasional contractions.  Contractions: Irritability. Vag. Bleeding: None.  Movement: Present. Denies leaking of fluid.   The following portions of the patient's history were reviewed and updated as appropriate: allergies, current medications, past family history, past medical history, past social history, past surgical history and problem list.   Objective:   Vitals:   03/03/19 1109  BP: 118/73  Pulse: 87   Self-Obtained  Fetal Status:     Movement:  Present     Assessment and Plan:  Pregnancy: G2P1001 at [redacted]w[redacted]d 1. 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 Supervision of other normal pregnancy, antepartum  3. Excessive weight gain during pregnancy in third trimester - Discussed this as the cause for added pelvic pressure  - 56# weight gain  Preterm labor symptoms and general obstetric precautions including but not limited to vaginal bleeding, contractions, leaking of fluid and fetal movement were reviewed in detail with the patient.  Return in about 2 weeks (around 03/17/2019) for [facility] In-Person.  Future Appointments  Date Time Provider [place]  03/22/2019  9:55 AM Nehemiah Settle, Tanna Savoy, DO [facility]     Time spent on virtual visit: 10 minutes  Kerry Hough, PA-C

## 2019-03-03 NOTE — Patient Instructions (Addendum)
AREA PEDIATRIC/FAMILY PRACTICE PHYSICIANS  [place] ([address]) . [facility] o Chambliss, MD; Eniola, MD; Hale, MD; Hensel, MD; McDiarmid, MD; McIntyer, MD; Neal, MD; Walden, MD o [address] [place], [place] [address] o [phone] o Mon-Fri 8:30-12:30, 1:30-5:00 o Providers come to see babies at [facility] o Accepting Medicaid . [facility] o Limited providers who accept newborns: Koirala, MD; Morrow, MD; Wolters, MD o [address], [place], [place] [address] o [phone] o Mon-Fri 8:00-5:30 o Babies seen by providers at [facility] o Does NOT accept Medicaid o Please call early in hospitalization for appointment (limited availability)  . [facility] o [place], MD o [address] [place], [place] [address] o [phone] o Mon, Tue, Thur, Fri 8:30-5:00, Wed 10:00-7:00 (closed 1-2pm) o Babies seen by [facility] providers o Accepting Medicaid . Rubin - Pediatrician o Rubin, MD o [address], [place], [place] [address] o [phone] o Mon-Fri 8:30-5:00, Sat 8:30-12:00 o Provider comes to see babies at [facility] o Accepting Medicaid o Must have been referred from current patients or contacted office prior to delivery . [facility] o Brown, MD; Chandler, MD; Ettefagh, MD; Grant, MD; Lester, MD; McCormick, MD; McQueen, MD; Prose, MD; Simha, MD; Stanley, MD; Stryffeler, NP; Tebben, NP o [address], [place], [place] [address] o [phone] o Mon, Tue, Thur, Fri 8:30-5:30, Wed 9:30-5:30, Sat 8:30-12:30 o Babies seen by [facility] providers o Accepting Medicaid o Only accepting infants of first-time parents or siblings of current patients o Hospital discharge coordinator will make follow-up appointment . Jack Amos o [address],  [place], [place]  [address] o [phone]   Fax - [phone] . [facility] o [address], [place], [place]  [address] o Phone - [phone]   Fax - [phone] . Shilpa Gosrani o [address], [place], [place]  [address] o [phone]  [place] ([address]) . [facility] o Bates, MD; Brass[place], MD; Cooper, Cox, MD; MD; Davis, MD; Dovico, MD; Ettefaugh, MD; Little, MD; Lowe, MD; Keiffer, MD; Melvin, MD; Sumner, MD; Williams, MD o [address], [place], [place] [address] o [phone] o Mon-Fri 8:30-5:00 (extended evenings Mon-Thur as needed), Sat-Sun 10:00-1:00 o Providers come to see babies at [facility] o Accepting Medicaid for families of first-time babies and families with all children in the household age 3 and under. Must register with office prior to making appointment (M-F only). . [facility] o Henson, NP; Knapp, MD; Lalonde, MD; Tysinger, PA o [address] [place], [place] [address] o [phone] o Mon-Fri 8:00-5:00 o Babies seen by providers at [facility] o Does NOT accept Medicaid/Commercial Insurance Only . [facility] [place] ([facility])  o Artis, MD; Barnes, MD; Bratton, MD; Coccaro, MD; Lockett Gardner, MD; Kramer, MD; Marshall, MD; Netherton, MD; Poleto, MD; Skinner, MD o [address] [place], [place] [address] o [phone] o Mon-Fri 8:30-5:30, Sat (Oct.-Mar.) 9:00-1:00 o Babies seen by providers at [facility] o Accepting Medicaid  [place] ([address]) . [facility] o Reid, MD; Warner, MD o [address], [place],  [address] o [phone] o Mon-Fri 8:30-5:00, Sat 8:30-12:00 o Providers come to see babies at [facility] o Does NOT accept Medicaid . [facility] Ricci Barker, PA; Mannie Stabile, MD; [place], [place]; Nancy Fetter, MD; Moreen Fowler, [place],  [place], [place] [address] o [phone] o Mon-Fri 8:00-5:00 o Babies seen by providers at [facility] o Does NOT accept Medicaid o Only accepting babies of parents who are patients o Please call early in hospitalization for appointment (limited availability) . [facility] Pediatricians Blanca Friend, MD; Sharlene Motts, MD; Rod Can, MD; Warner Mccreedy, NP; Sabra Heck, MD; Ermalinda Memos, MD; Sharlett Iles, NP; Aurther Loft, MD; Jerrye Beavers, MD; Marcello Moores, MD; Berline Lopes, MD; Charolette Forward, MD o [place]. [place], [place], [place] [address] o [phone] o Mon-Fri 8:00-5:00, Sat 9:00-12:00 o Providers come to see babies at [facility] o Does NOT accept [facility] [phone]) . [facility] providers accepting new patients: Dayna Ramus, NP; [place], [place], [place], [place] [address] o [phone] o Mon-Fri 8:00-5:00 o Babies seen by providers at [facility] o Does NOT accept Medicaid o Only accepting babies of parents who are patients o Please call early in hospitalization for appointment (limited availability) . [facility] Oswaldo Conroy, MD; Sheran Lawless, MD o [address] [place], [place] [address] o [phone] (press 1 to schedule appointment) o Mon-Fri 8:00-5:00 o Providers come to see babies at [facility] o Does NOT accept Medicaid . [facility] Jodi Mourning, MD o [address] [place], [place] [address] o [phone] o Mon-Fri 8:30-5:00 (lunch 12:30-1:00), extended hours by appointment only Wed 5:00-6:30 o Babies seen by [facility] providers o Accepting Medicaid . [place] at Evalyn Casco, MD; [place], MD; Ethlyn Gallery, MD o [place], [place], [place] [address] o [phone] o Mon-Fri 8:00-5:00 o Babies seen by [facility] providers o Does NOT accept Medicaid . Therapist, music at [place], MD; Yong Channel, MD; [place], [place] [address] [place], [place] [address] o  [phone] o Mon-Fri 8:00-5:00 o Babies seen by [facility] providers o Does NOT accept Medicaid . [place], [place]; [place], [place]; [place], NP; Albertina Parr, MD; Frederic Jericho, MD; Ronney Lion, MD; Carlos Levering, NP; Jerelene Redden, NP; Tomasita Crumble, NP; Ronelle Nigh, NP; Corinna Lines, MD; [place], MD o [address] [place], [place] [address] o [phone] o Mon-Fri 8:30-5:00, Sat 10:00-1:00 o Providers come to see babies at [facility] o Does NOT accept Medicaid o Free prenatal information session Tuesdays at 4:45pm . [facility] Porfirio Oar, MD; [place], [place]; [place], [place]; [place], [place]., [place] [address] o [phone] o Mon-Fri 7:30-5:30 o Babies seen by [facility] providers . [facility] Children[facility] Doctor o [address], [place], [place], [place]  [address] o [phone]   Fax - [phone]  [place] [phone] & [phone]) . [place], [place] o [address] [place], [place] [address] o [phone] o Mon-Thur 8:00-6:00 o Providers come to see babies at [place] Medicaid . [place], NP; Melford Aase, MD; [place], [place]; [place], [address] [place], [place] [address] o [phone] o Mon-Thur 7:30-7:30, Fri 7:30-4:30 o Babies seen by [facility] providers o Accepting Medicaid . [facility] Nyra Jabs, MD; Cristino Martes, NP; Gertie Baron, MD o [place] [address], [place], [place] [address] o [phone] o Mon-Fri 8:30-5:00, Sat 8:30-12:00 o Providers come to see babies at [place] Medicaid o Must have "Meet & Greet" appointment at office prior to delivery . [place] ([place]) o Hubbard,  MD; Juleen China, MD; Clydene Laming, [place] [address], [place], [place] [address] o [phone] o Mon-Wed 8:00-6:00, Thur-Fri 8:00-5:00, Sat 9:00-12:00 o Providers come to see  babies at [facility] o Does NOT accept Medicaid o Only accepting siblings of current patients . [facility] [place], [place], [place], [place]  [address] o [phone]   Fax [phone] . [place] [facility] [place] [address], [place], [place]  [address] o [phone]   Fax - Artois Craig [phone] & [phone]) . Therapist, music at [place], DO; [place], [address] [place], [place] [address] o [phone] o Mon-Fri 7:00-5:00 o Babies seen by [facility] providers o Does NOT accept Medicaid . [place], MD; [place], [place]; [place], [place], [place], [place] [address] o [phone] o Mon-Fri 8:00-5:00 o Babies seen by [facility] providers o Accepting Medicaid . Reed, MD; [place], [place]; [place], NP; [place], [place], [place], [place] [address] o [phone] o Mon-Fri 8:00-5:00 o Babies seen by providers at [facility] [place] [phone]) . [facility] [place], [place] o [address] [place], [place] [address] o [phone] o Mon-Fri 8:00-5:00 o Babies seen by [facility] providers o Does NOT accept Medicaid o Limited availability, please call early in hospitalization to schedule follow-up . [facility] Leilani Merl, PA; Maisie Fus, MD; [place], MD; [place], [place]; Jeannine Kitten, MD; [place], [place]y [facility] [address], [place], [place] [address] o [phone] o Mon-Fri 8:30-5:00, Sat 9:00-12:00 o Babies seen by providers at [facility] o Accepting Medicaid o Please register online then schedule online or call office o [email or web address] . [place] [facility] [facility] [place]) Kristian Covey, NP; Dwyane Dee, MD; Leonidas Romberg, PA o [address] [place], [place], [place] [address] o [phone] o Mon-Fri 8:00-5:00 o Babies seen by providers at [facility] o Accepting Medicaid . [place] ([facility]) Dairl Ponder, MD; Rayvon Char, NP; Melina Modena, MD o [address] [place], [place], [place] [address] o [phone] o Mon-Fri 8:00-5:30, Sat&Sun by appointment (phones open at 8:30) o Babies seen by [facility] providers o Accepting Medicaid o Must be a first-time baby or sibling of current patient . [place], [address], [place], [place]  [address] o [phone]   Fax - [phone]  [place] [phone] & [phone]) . [place], [place]; [place], [place]; Benjamine Mola, MD; [place], [place]; Harrell Lark, MD o [address], [place], [place] [address] o [phone] o Mon-Thur 8:00-7:00, Fri 8:00-5:00, Sat 8:00-12:00, Sun 9:00-12:00 o Babies seen by [facility] providers o Accepting Medicaid . [facility], MD; Ruthann Cancer, MD; [facility], MD o [address], [place],  [address] o [phone] o Mon-Thur 8:00-5:00 o Babies seen by providers at [facility] o Accepting Medicaid . [facility] [place], MD; Coe-Goins, MD; Amedeo Plenty, MD; Bobby Rumpf, MD; List, MD; Lavonia Drafts, MD; Ruthann Cancer, MD; Selinda Eon, MD; Audie Box, MD; Jim Like, MD; Christie Nottingham, MD; [place] Hart[place], MD; [place] Nunnery, MD o [address] [place], [place]  [address] o [phone] o Mon-Fri 8:00-5:30, Sat (Oct.-Mar.) 9:00-1:00 o Babies seen by providers at [facility] o Accepting Medicaid o Must fill out new patient packet, available online at [email or web address] . [place] [facility] Barnabas Lister, NP; [place] Kingfisher, NP; Claiborne Billings, NP; Rolla Plate, MD; [place], [place];  Carola Rhine, MD; Tyron Russell, MD; Delia Chimes, NP o [address], [place], [place] [address] o [phone] o Mon-Thur 8:00-5:30, Fri 8:00-5:00 o Babies seen by providers at [place] [phone]) . [place], [place]; [place], MD; Dennard Schaumann, MD; [place], [place] o [address] [place], [place] [address] o [phone] o Mon-Fri 8:00-5:00 o Babies seen by providers at [place] [phone]) . [place] at [place], [place]; Olen Pel, MD; [place], [place], [place], [place] [address] o [phone] o Mon-Fri 8:00-5:00 o Babies seen by providers at [facility] o Does NOT accept Medicaid o Limited appointment availability, please call early in hospitalization  . Therapist, music at [place], [place]; [place], [address], [place], [place] [address] o [phone] o Mon-Fri 8:00-5:00 o Babies seen by [facility] providers o Does NOT accept Medicaid . [facility] Su Grand, MD; Guy Sandifer, MD; [place], [place]; [place], [place] [address], [place], [place] [address] o [phone] o Mon-Fri 8:00-5:00 o After hours clinic [facility] [place], [place] [address]) [phone] Mon-Fri 5:00-8:00, Sat 12:00-6:00, Sun 10:00-4:00 o Babies seen by [facility] providers o Accepting Medicaid . [place] at [facility] o [address], [place], [place]  [address] o [phone]   Fax - [phone]  [place] [phone]) . Therapist, music at [facility], MD o [address], [place], [place] [address] o [phone] o Mon-Fri 8:00-5:00 o Babies seen by [facility] providers o Does NOT accept Medicaid . [place] ([place] [facility] [place]) Bing Neighbors, MD o [address] [place], [place], [place] [address] o [phone] o  Mon-Thur 8:00-7:00, Fri 8:00-5:00, Sat 8:00-12:00 o Babies seen by providers at [facility] o Accepting Medicaid - but does not have vaccinations in office (must be received elsewhere) o Limited availability, please call early in hospitalization  [place] ([address]) . [place], [place], [place] [address] o [phone]  Fax [phone]   Fetal Movement Counts Patient Name: ________________________________________________ Patient Due Date: ____________________ What is a fetal movement count?  A fetal movement count is the number of times that you feel your baby move during a certain amount of time. This may also be called a fetal kick count. A fetal movement count is recommended for every pregnant woman. You may be asked to start counting fetal movements as early as week 28 of your pregnancy. Pay attention to when your baby is most active. You may notice your baby's sleep and wake cycles. You may also notice things that make your baby move more. You should do a fetal movement count:  When your baby is normally most active.  At the same time each day. A good time to count movements is while you are resting, after having something to eat and drink. How do I count fetal movements? 1. Find a quiet, comfortable area. Sit, or lie down on your  side. 2. Write down the date, the start time and stop time, and the number of movements that you felt between those two times. Take this information with you to your health care visits. 3. For 2 hours, count kicks, flutters, swishes, rolls, and jabs. You should feel at least 10 movements during 2 hours. 4. You may stop counting after you have felt 10 movements. 5. If you do not feel 10 movements in 2 hours, have something to eat and drink. Then, keep resting and counting for 1 hour. If you feel at least 4 movements during that hour, you may stop counting. Contact a health care provider if:  You  feel fewer than 4 movements in 2 hours.  Your baby is not moving like he or she usually does. Date: ____________ Start time: ____________ Stop time: ____________ Movements: ____________ Date: ____________ Start time: ____________ Stop time: ____________ Movements: ____________ Date: ____________ Start time: ____________ Stop time: ____________ Movements: ____________ Date: ____________ Start time: ____________ Stop time: ____________ Movements: ____________ Date: ____________ Start time: ____________ Stop time: ____________ Movements: ____________ Date: ____________ Start time: ____________ Stop time: ____________ Movements: ____________ Date: ____________ Start time: ____________ Stop time: ____________ Movements: ____________ Date: ____________ Start time: ____________ Stop time: ____________ Movements: ____________ Date: ____________ Start time: ____________ Stop time: ____________ Movements: ____________ This information is not intended to replace advice given to you by your health care provider. Make sure you discuss any questions you have with your health care provider. Document Released: 04/03/2006 Document Revised: 03/24/2018 Document Reviewed: 04/13/2015 Elsevier Patient Education  2020 Elsevier Inc.  [employer] of the uterus can occur throughout pregnancy, but they are not always a sign that you are in labor. You may have practice contractions called Braxton Hicks contractions. These false labor contractions are sometimes confused with true labor. What are Deberah [place]ton contractions? Braxton Hicks contractions are tightening movements that occur in the muscles of the uterus before labor. Unlike true labor contractions, these contractions do not result in opening (dilation) and thinning of the cervix. Toward the end of pregnancy (32-34 weeks), Braxton Hicks contractions can happen more often and may become stronger. These contractions are sometimes difficult  to tell apart from true labor because they can be very uncomfortable. You should not feel embarrassed if you go to the hospital with false labor. Sometimes, the only way to tell if you are in true labor is for your health care provider to look for changes in the cervix. The health care provider will do a physical exam and may monitor your contractions. If you are not in true labor, the exam should show that your cervix is not dilating and your water has not broken. If there are no other health problems associated with your pregnancy, it is completely safe for you to be sent home with false labor. You may continue to have Braxton Hicks contractions until you go into true labor. How to tell the difference between true labor and false labor True labor  Contractions last 30-70 seconds.  Contractions become very regular.  Discomfort is usually felt in the top of the uterus, and it spreads to the lower abdomen and low back.  Contractions do not go away with walking.  Contractions usually become more intense and increase in frequency.  The cervix dilates and gets thinner. False labor  Contractions are usually shorter and not as strong as true labor contractions.  Contractions are usually irregular.  Contractions are often felt in the front of the lower  abdomen and in the groin.  Contractions may go away when you walk around or change positions while lying down.  Contractions get weaker and are shorter-lasting as time goes on.  The cervix usually does not dilate or become thin. Follow these instructions at home:   Take over-the-counter and prescription medicines only as told by your health care provider.  Keep up with your usual exercises and follow other instructions from your health care provider.  Eat and drink lightly if you think you are going into labor.  If Braxton Hicks contractions are making you uncomfortable: ? Change your position from lying down or resting to walking, or  change from walking to resting. ? Sit and rest in a tub of warm water. ? Drink enough fluid to keep your urine pale yellow. Dehydration may cause these contractions. ? Do slow and deep breathing several times an hour.  Keep all follow-up prenatal visits as told by your health care provider. This is important. Contact a health care provider if:  You have a fever.  You have continuous pain in your abdomen. Get help right away if:  Your contractions become stronger, more regular, and closer together.  You have fluid leaking or gushing from your vagina.  You pass blood-tinged mucus (bloody show).  You have bleeding from your vagina.  You have low back pain that you never had before.  You feel your baby's head pushing down and causing pelvic pressure.  Your baby is not moving inside you as much as it used to. Summary  Contractions that occur before labor are called Braxton Hicks contractions, false labor, or practice contractions.  Braxton Hicks contractions are usually shorter, weaker, farther apart, and less regular than true labor contractions. True labor contractions usually become progressively stronger and regular, and they become more frequent.  Manage discomfort from [facility] contractions by changing position, resting in a warm bath, drinking plenty of water, or practicing deep breathing. This information is not intended to replace advice given to you by your health care provider. Make sure you discuss any questions you have with your health care provider. Document Released: 07/18/2016 Document Revised: 02/14/2017 Document Reviewed: 07/18/2016 Elsevier Patient Education  2020 [employer]  Places to have your son circumcised:                                                                      [facility]     [phone]   $480 while you are in hospital         [facility]              [phone]   $269 by 4 wks                      [facility]                     [phone]    $269 by 7 days [facility]                    [phone]   $269 by 4 wks [facility]             [phone]   $225 by 2 wks  These prices sometimes change but are roughly what you can expect to pay. Please call and confirm pricing.   Circumcision is considered an elective/non-medically necessary procedure. There are many reasons parents decide to have their sons circumsized. During the first year of life circumcised males have a reduced risk of urinary tract infections but after this year the rates between circumcised males and uncircumcised males are the same.  It is safe to have your son circumcised outside of the hospital and the places above perform them regularly.   Deciding about Circumcision in Baby Boys  (Up-to-date The Basics)  What is circumcision?   Circumcision is a surgery that removes the skin that covers the tip of the penis, called the "foreskin" Circumcision is usually done when a boy is between 38 and 42 days old. In the [place], circumcision is common. In some other countries, fewer boys are circumcised. Circumcision is a common tradition in some religions.  Should I have my baby boy circumcised?   There is no easy answer. Circumcision has some benefits. But it also has risks. After talking with your doctor, you will have to decide for yourself what is right for your family.  What are the benefits of circumcision?   Circumcised boys seem to have slightly lower rates of: ?Urinary tract infections ?Swelling of the opening at the tip of the penis Circumcised men seem to have slightly lower rates of: ?Urinary tract infections ?Swelling of the opening at the tip of the penis ?Penis cancer ?HIV and other infections that you catch during sex ?Cervical cancer in the women they have sex with Even so, in the [place], the risks of these problems are small - even in boys and men who have not been circumcised. Plus, boys and men who are not circumcised can reduce these extra  risks by: ?Cleaning their penis well ?Using condoms during sex  What are the risks of circumcision?  Risks include: ?Bleeding or infection from the surgery ?Damage to or amputation of the penis ?A chance that the doctor will cut off too much or not enough of the foreskin ?A chance that sex won't feel as good later in life Only about 1 out of every 200 circumcisions leads to problems. There is also a chance that your health insurance won't pay for circumcision.  How is circumcision done in baby boys?  First, the baby gets medicine for pain relief. This might be a cream on the skin or a shot into the base of the penis. Next, the doctor cleans the baby's penis well. Then he or she uses special tools to cut off the foreskin. Finally, the doctor wraps a bandage (called gauze) around the baby's penis. If you have your baby circumcised, his doctor or nurse will give you instructions on how to care for him after the surgery. It is important that you follow those instructions carefully.

## 2019-03-03 NOTE — Progress Notes (Signed)
I connected with  Kristin Brock on 03/03/19 at 1109 by MyChart and verified that I am speaking with the correct person using two identifiers.   I discussed the limitations, risks, security and privacy concerns of performing an evaluation and management service by telephone and the availability of in person appointments. I also discussed with the patient that there may be a patient responsible charge related to this service. The patient expressed understanding and agreed to proceed.  Annabell Howells, RN 03/03/2019  11:08 AM

## 2019-03-19 NOTE — L&D Delivery Note (Addendum)
OB/GYN Faculty Practice Delivery Note  Kristin Brock is a 24 y.o. G2P100 at [redacted]w[redacted]d. She was admitted for IOL d/t PD.  ROM: 6h 64m with clear fluid GBS Status: negative Maximum Maternal Temperature: 98.7*F  Labor Progress: . Patient presented earlier today with CVE 2/60/-2 with spontaneous contractions. She received a foley balloon, pitocin, and AROM and progressed to complete.  Delivery Date/Time: 04/24/2019 at 2354 Delivery: Called to room and patient was complete and pushing. Head delivered LOA. No nuchal cord present. Shoulder and body delivered in usual fashion. Infant with spontaneous cry, placed on mother's abdomen, dried and stimulated. Cord clamped x 2 after 1-minute delay, and cut by FOB. Cord blood drawn. Placenta delivered spontaneously with gentle cord traction. Fundus firm with massage and Pitocin. Labia, perineum, vagina, and cervix inspected with a left vaginal side wall laceration, a periurethral laceration, and hemostatic right vaginal sidewall abrasions.  Placenta: 3VC, intact, to L&D Complications: none Lacerations: left vaginal sidewall, periurethral both repaired with 4-0 monocryl EBL: Analgesia: epidural  Postpartum Planning [x]  message to sent to schedule follow-up  [x]  vaccines UTD  Infant: female  APGARs 8,9  3545 g  , MD [facility], PGY-1 04/25/2019, 12:13 AM  GME ATTESTATION:  I saw and evaluated the patient. I was gloved and gowned throughout the delivery, and I personally repaired the left vaginal side wall and periurethral abrasions. I agree with the findings and the plan of care as documented in the resident's note.  [facility], DO OB Fellow, Faculty [facility], Center for [facility] 04/25/2019 12:58 AM

## 2019-03-22 ENCOUNTER — Encounter: Payer: Medicaid Other | Admitting: Family Medicine

## 2019-03-22 ENCOUNTER — Encounter: Payer: Self-pay | Admitting: Family Medicine

## 2019-03-22 ENCOUNTER — Telehealth: Payer: Self-pay | Admitting: Family Medicine

## 2019-03-22 NOTE — Telephone Encounter (Signed)
Attempted to contact patient to get her rescheduled for her missed ob appointment. No answer number stated that the call could not be completed at this time. No show letter mailed  °

## 2019-03-24 ENCOUNTER — Ambulatory Visit (HOSPITAL_COMMUNITY)
Admission: RE | Admit: 2019-03-24 | Discharge: 2019-03-24 | Disposition: A | Discharge status: Home / Self Care | Payer: Medicaid Other | Source: Ambulatory Visit | Attending: Obstetrics | Admitting: Obstetrics

## 2019-03-24 ENCOUNTER — Encounter (HOSPITAL_COMMUNITY): Payer: Self-pay

## 2019-03-24 ENCOUNTER — Ambulatory Visit (HOSPITAL_COMMUNITY): Payer: Medicaid Other | Admitting: *Deleted

## 2019-03-24 ENCOUNTER — Other Ambulatory Visit: Payer: Self-pay

## 2019-03-24 DIAGNOSIS — O99311 Alcohol use complicating pregnancy, first trimester: Secondary | ICD-10-CM | POA: Insufficient documentation

## 2019-03-24 DIAGNOSIS — O99310 Alcohol use complicating pregnancy, unspecified trimester: Secondary | ICD-10-CM | POA: Diagnosis present

## 2019-03-24 DIAGNOSIS — Z362 Encounter for other antenatal screening follow-up: Secondary | ICD-10-CM

## 2019-03-24 DIAGNOSIS — O99313 Alcohol use complicating pregnancy, third trimester: Secondary | ICD-10-CM

## 2019-03-24 DIAGNOSIS — Z3A36 36 weeks gestation of pregnancy: Secondary | ICD-10-CM

## 2019-03-29 ENCOUNTER — Encounter: Payer: Medicaid Other | Admitting: Family Medicine

## 2019-03-30 ENCOUNTER — Encounter: Payer: Medicaid Other | Admitting: Obstetrics and Gynecology

## 2019-04-06 ENCOUNTER — Ambulatory Visit (INDEPENDENT_AMBULATORY_CARE_PROVIDER_SITE_OTHER): Payer: Medicaid Other | Admitting: Family Medicine

## 2019-04-06 ENCOUNTER — Other Ambulatory Visit (HOSPITAL_COMMUNITY)
Admission: RE | Admit: 2019-04-06 | Discharge: 2019-04-06 | Disposition: A | Discharge status: Home / Self Care | Payer: Medicaid Other | Source: Ambulatory Visit | Attending: Family Medicine | Admitting: Family Medicine

## 2019-04-06 ENCOUNTER — Other Ambulatory Visit: Payer: Self-pay

## 2019-04-06 VITALS — BP 124/64 | HR 88 | Wt 198.4 lb

## 2019-04-06 DIAGNOSIS — Z3A38 38 weeks gestation of pregnancy: Secondary | ICD-10-CM

## 2019-04-06 DIAGNOSIS — O2603 Excessive weight gain in pregnancy, third trimester: Secondary | ICD-10-CM

## 2019-04-06 DIAGNOSIS — Z3480 Encounter for supervision of other normal pregnancy, unspecified trimester: Secondary | ICD-10-CM | POA: Diagnosis not present

## 2019-04-06 DIAGNOSIS — O99333 Smoking (tobacco) complicating pregnancy, third trimester: Secondary | ICD-10-CM

## 2019-04-06 DIAGNOSIS — O99311 Alcohol use complicating pregnancy, first trimester: Secondary | ICD-10-CM

## 2019-04-06 DIAGNOSIS — O99313 Alcohol use complicating pregnancy, third trimester: Secondary | ICD-10-CM

## 2019-04-06 LAB — OB RESULTS CONSOLE GBS: GBS: NEGATIVE

## 2019-04-06 NOTE — Progress Notes (Signed)
   PRENATAL VISIT NOTE  Subjective:  Kristin Brock is a 24 y.o. G2P1001 at [redacted]w[redacted]d being seen today for ongoing prenatal care.  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  Patient reports no complaints.  Contractions: Irritability. Vag. Bleeding: None.  Movement: Present. Denies leaking of fluid.   The following portions of the patient's history were reviewed and updated as appropriate: allergies, current medications, past family history, past medical history, past social history, past surgical history and problem list.   Objective:   Vitals:   04/06/19 1507  BP: 124/64  Pulse: 88  Weight: 198 lb 6.4 oz (90 kg)    Fetal Status: Fetal Heart Rate (bpm): 153 Fundal Height: 37 cm Movement: Present  Presentation: Vertex  General:  Alert, oriented and cooperative. Patient is in no acute distress.  Skin: Skin is warm and dry. No rash noted.   Cardiovascular: Normal heart rate noted  Respiratory: Normal respiratory effort, no problems with respiration noted  Abdomen: Soft, gravid, appropriate for gestational age.  Pain/Pressure: Present     Pelvic: Cervical exam performed Dilation: 1 Effacement (%): 30 Station: -3  Extremities: Normal range of motion.  Edema: Trace  Mental Status: Normal mood and affect. Normal behavior. Normal judgment and thought content.   Assessment and Plan:  Pregnancy: G2P1001 at [redacted]w[redacted]d Grecia was seen today for routine prenatal visit.  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       - Denies any recent alcohol use  Excessive weight gain during pregnancy in third trimester Wt Readings from Last 4 Encounters:  04/06/19 198 lb 6.4 oz (90 kg)  02/03/19 188 lb 11.2 oz (85.6 kg)  01/06/19 185 lb 8 oz (84.1 kg)  10/07/18 164 lb 8 oz (74.6 kg)        - Reports she is not watching diet and can't help it   Tobacco use affecting pregnancy in third trimester, antepartum       - Smoking about 5 cigarettes daily; open to Nicotine patch on L&D  Term labor symptoms and general obstetric precautions including but not limited to vaginal bleeding, contractions, leaking of fluid and fetal movement were reviewed in detail with the patient. Please refer to After Visit Summary for other counseling recommendations.   Return in about 1 week (around 04/13/2019) for LOB; in-person .  No future appointments.  Joselyn Arrow, MD

## 2019-04-07 LAB — GC/CHLAMYDIA PROBE AMP (~~LOC~~) NOT AT ARMC
Chlamydia: NEGATIVE
Comment: NEGATIVE
Comment: NORMAL
Neisseria Gonorrhea: NEGATIVE

## 2019-04-10 LAB — CULTURE, BETA STREP (GROUP B ONLY): Strep Gp B Culture: NEGATIVE

## 2019-04-14 ENCOUNTER — Encounter: Payer: Self-pay | Admitting: Medical

## 2019-04-14 ENCOUNTER — Other Ambulatory Visit (HOSPITAL_COMMUNITY)
Admission: RE | Admit: 2019-04-14 | Discharge: 2019-04-14 | Disposition: A | Discharge status: Home / Self Care | Payer: Medicaid Other | Source: Ambulatory Visit | Attending: Medical | Admitting: Medical

## 2019-04-14 ENCOUNTER — Ambulatory Visit (INDEPENDENT_AMBULATORY_CARE_PROVIDER_SITE_OTHER): Payer: Medicaid Other | Admitting: Medical

## 2019-04-14 ENCOUNTER — Other Ambulatory Visit: Payer: Self-pay

## 2019-04-14 VITALS — BP 126/69 | HR 82 | Wt 195.7 lb

## 2019-04-14 DIAGNOSIS — O99313 Alcohol use complicating pregnancy, third trimester: Secondary | ICD-10-CM

## 2019-04-14 DIAGNOSIS — O23593 Infection of other part of genital tract in pregnancy, third trimester: Secondary | ICD-10-CM

## 2019-04-14 DIAGNOSIS — O2343 Unspecified infection of urinary tract in pregnancy, third trimester: Secondary | ICD-10-CM

## 2019-04-14 DIAGNOSIS — A5901 Trichomonal vulvovaginitis: Secondary | ICD-10-CM

## 2019-04-14 DIAGNOSIS — O2341 Unspecified infection of urinary tract in pregnancy, first trimester: Secondary | ICD-10-CM

## 2019-04-14 DIAGNOSIS — O99311 Alcohol use complicating pregnancy, first trimester: Secondary | ICD-10-CM

## 2019-04-14 DIAGNOSIS — O23591 Infection of other part of genital tract in pregnancy, first trimester: Secondary | ICD-10-CM | POA: Diagnosis not present

## 2019-04-14 DIAGNOSIS — Z3480 Encounter for supervision of other normal pregnancy, unspecified trimester: Secondary | ICD-10-CM

## 2019-04-14 DIAGNOSIS — Z3A39 39 weeks gestation of pregnancy: Secondary | ICD-10-CM

## 2019-04-14 DIAGNOSIS — A599 Trichomoniasis, unspecified: Secondary | ICD-10-CM

## 2019-04-14 NOTE — Progress Notes (Signed)
   PRENATAL VISIT NOTE  Subjective:  Kristin Brock is a 24 y.o. G2P1001 at [redacted]w[redacted]d being seen today for ongoing prenatal care.  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  Patient reports no complaints.  Contractions: Irritability. Vag. Bleeding: None.  Movement: Present. Denies leaking of fluid.   The following portions of the patient's history were reviewed and updated as appropriate: allergies, current medications, past family history, past medical history, past social history, past surgical history and problem list.   Objective:   Vitals:   04/14/19 1527  BP: 126/69  Pulse: 82  Weight: 195 lb 11.2 oz (88.8 kg)    Fetal Status: Fetal Heart Rate (bpm): 155 Fundal Height: 39 cm Movement: Present  Presentation: Vertex  General:  Alert, oriented and cooperative. Patient is in no acute distress.  Skin: Skin is warm and dry. No rash noted.   Cardiovascular: Normal heart rate noted  Respiratory: Normal respiratory effort, no problems with respiration noted  Abdomen: Soft, gravid, appropriate for gestational age.  Pain/Pressure: Present     Pelvic: Cervical exam performed Dilation: 1 Effacement (%): 50 Station: -2  Extremities: Normal range of motion.  Edema: Trace  Mental Status: Normal mood and affect. Normal behavior. Normal judgment and thought content.   Assessment and Plan:  Pregnancy: G2P1001 at [redacted]w[redacted]d 1. Supervision of other normal pregnancy, antepartum - IOL scheduled for 2/6 - Peds list given again and importance of choosing prior to delivery discussed  - GBS negative   2. 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 Trichomonal  vaginitis during pregnancy in first trimester - treated in October, TOC today  - Cervicovaginal ancillary only( [place])  4. UTI (urinary tract infection) in pregnancy in first trimester - Culture, OB Urine - TOC   Term labor symptoms and general obstetric precautions including but not limited to vaginal bleeding, contractions, leaking of fluid and fetal movement were reviewed in detail with the patient. Please refer to After Visit Summary for other counseling recommendations.   Return in about 1 week (around 04/21/2019) for In-Person, LOB, NST/BPP.  Future Appointments  Date Time Provider Department Center  04/24/2019  8:15 AM [facility] SCHED ROOM [facility] None    Vonzella Nipple, PA-C

## 2019-04-14 NOTE — Patient Instructions (Signed)
AREA PEDIATRIC/FAMILY PRACTICE PHYSICIANS  [place] ([address]) . [facility] o Chambliss, MD; Eniola, MD; Hale, MD; Hensel, MD; McDiarmid, MD; McIntyer, MD; Neal, MD; Walden, MD o [address] [place], [place] [address] o [phone] o Mon-Fri 8:30-12:30, 1:30-5:00 o Providers come to see babies at [facility] o Accepting Medicaid . [facility] o Limited providers who accept newborns: Koirala, MD; Morrow, MD; Wolters, MD o [address], [place], [place] [address] o [phone] o Mon-Fri 8:00-5:30 o Babies seen by providers at [facility] o Does NOT accept Medicaid o Please call early in hospitalization for appointment (limited availability)  . [facility] o Mulberry, MD o [address] [place], [place] [address] o [phone] o Mon, Tue, Thur, Fri 8:30-5:00, Wed 10:00-7:00 (closed 1-2pm) o Babies seen by [facility] providers o Accepting Medicaid . [facility] - Pediatrician o Rubin, MD o [address], [place], [place] [address] o [phone] o Mon-Fri 8:30-5:00, Sat 8:30-12:00 o Provider comes to see babies at [facility] o Accepting Medicaid o Must have been referred from current patients or contacted office prior to delivery . [facility] ([facility] o Brown, MD; Chandler, MD; Ettefagh, MD; Grant, MD; Lester, MD; McCormick, MD; McQueen, MD; Prose, MD; Simha, MD; Stanley, MD; Stryffeler, NP; Tebben, NP o [address], [place], [place] [address] o [phone] o Mon, Tue, Thur, Fri 8:30-5:30, Wed 9:30-5:30, Sat 8:30-12:30 o Babies seen by [facility] providers o Accepting Medicaid o Only accepting infants of first-time parents or siblings of current patients o Hospital discharge coordinator will make follow-up appointment . Jack Amos o [address],  [place], [place]  [address] o [phone]   Fax - [phone] . [facility] o [address], [place], [place]  [address] o Phone - [phone]   Fax - [phone] . Shilpa Gosrani o [address], [place], [place]  [address] o [phone]  [place] ([address]) . [facility] o Bates, MD; [place], MD; Cooper, Cox, MD; MD; Davis, MD; Dovico, MD; Ettefaugh, MD; Little, MD; Lowe, MD; Keiffer, MD; Melvin, MD; Sumner, MD; Williams, MD o [address], [place], [place] [address] o [phone] o Mon-Fri 8:30-5:00 (extended evenings Mon-Thur as needed), Sat-Sun 10:00-1:00 o Providers come to see babies at [facility] o Accepting Medicaid for families of first-time babies and families with all children in the household age 3 and under. Must register with office prior to making appointment (M-F only). . [facility] o Henson, NP; Knapp, MD; Lalonde, MD; Tysinger, PA o [address] [place], [place] [address] o [phone] o Mon-Fri 8:00-5:00 o Babies seen by providers at [facility] o Does NOT accept Medicaid/Commercial Insurance Only . [facility] [place] [facility]  o Artis, MD; Barnes, MD; Bratton, MD; Coccaro, MD; Lockett Gardner, MD; Kramer, MD; Marshall, MD; Netherton, MD; Poleto, MD; Skinner, MD o [address] [place], [place] [address] o [phone] o Mon-Fri 8:30-5:30, Sat (Oct.-Mar.) 9:00-1:00 o Babies seen by providers at [facility] o Accepting Medicaid  [place] ([address]) . [facility] o Reid, MD; Warner, MD o [address], [place],  [address] o [phone] o Mon-Fri 8:30-5:00, Sat 8:30-12:00 o Providers come to see babies at [facility] o Does NOT accept Medicaid . [facility] o Becker, PA; Hagler, MD; Scifres, PA; Sun, MD; Swayne, MD o [address],  [place], [place] [address] o [phone] o Mon-Fri 8:00-5:00 o Babies seen by providers at [facility] o Does NOT accept Medicaid o Only accepting babies of parents who are patients o Please call early in hospitalization for appointment (limited availability) . [facility] o Clark, MD; Frye, MD; Kelleher, MD; Mack, NP; Miller, MD; O'Keller, MD; Patterson, NP; Pudlo, MD; Puzio, MD; Thomas, MD; Tucker, MD; Twiselton, MD o [address], [place], [place] [address] o [phone] o Mon-Fri 8:00-5:00, Sat 9:00-12:00 o Providers come to see babies at [facility] o Does NOT accept Medicaid  [place] ([address]) . [facility] o Limited providers accepting new patients: Brake, NP; Wharton, PA o [address], [place], [place] [address] o [phone] o Mon-Fri 8:00-5:00 o Babies seen by providers at [facility] o Does NOT accept Medicaid o Only accepting babies of parents who are patients o Please call early in hospitalization for appointment (limited availability) . [facility] o Gay, MD; Quinlan, MD o [address] [place], [place] [address] o [phone] (press [phone] to schedule appointment) o Mon-Fri 8:00-5:00 o Providers come to see babies at [facility] o Does NOT accept Medicaid . [facility] o Mazer, MD o [address] [place], [place] [address] o [phone] o Mon-Fri 8:30-5:00 (lunch 12:30-1:00), extended hours by appointment only Wed 5:00-6:30 o Babies seen by [facility] providers o Accepting Medicaid . [facility] o Banks, MD; Jordan, MD; Koberlein, MD o [address], [place], [place] [address] o [phone] o Mon-Fri 8:00-5:00 o Babies seen by [facility] providers o Does NOT accept Medicaid . [facility] o Parker, MD; Hunter, MD; Wallace, DO o [address] [place], [place]  [address] o [phone] o Mon-Fri 8:00-5:00 o Babies seen by [facility] providers o Does NOT accept Medicaid . [facility] o Brandon, PA; Brecken, PA; Christy, NP; Dees, MD; DeClaire, MD; DeWeese, MD; Hansen, NP; Mills, NP; Parrish, NP; Smoot, NP; Summer, MD; Vapne, MD o [address] [place], [place] [address] o [phone] o Mon-Fri 8:30-5:00, Sat 10:00-1:00 o Providers come to see babies at [facility] o Does NOT accept Medicaid o Free prenatal information session Tuesdays at 4:45pm . [facility] o Bouska, MD; Gordon, PA; Jeffery, PA; Weber, PA o [address] [place] [address] o [phone] o Mon-Fri 7:30-5:30 o Babies seen by [facility] providers . [facility] o [address], [place], [place], [place]  [address] o [phone]   Fax - [phone]  [place] ([phone]) . [facility] o Reese, MD o [address] [place], [place] [address] o [phone] o Mon-Thur 8:00-6:00 o Providers come to see babies at [facility] o Accepting Medicaid . [facility] o Anderson, NP; Badger, MD; Beal, PA; Spencer, PA o [address] [place],  [address] o [phone] o Mon-Thur 7:30-7:30, Fri 7:30-4:30 o Babies seen by [facility] providers o Accepting Medicaid . [facility] o Agbuya, MD; Klett, NP; Romgoolam, MD o [address], ,  [address] o [phone] o Mon-Fri 8:30-5:00, Sat 8:30-12:00 o Providers come to see babies at [facility] o Accepting Medicaid o Must have "Meet & Greet" appointment at office prior to delivery . [facility]  ([facility] ) o McCord,   MD; Juleen China, MD; Clydene Laming, [address], [place], [place] [address] o [phone] o Mon-Wed 8:00-6:00, Thur-Fri 8:00-5:00, Sat 9:00-12:00 o Providers come to  see babies at [facility] o Does NOT accept Medicaid o Only accepting siblings of current patients . [facility] [place], [place], [place], [place]  [address] o [phone]   Fax [phone] . [facility] [place] [address], [place], [place]  [address] o [phone]   Fax - Morton Gorman [phone] & [phone]) . Therapist, music at [place], DO; [place], [address] [place], [place] [address] o [phone] o Mon-Fri 7:00-5:00 o Babies seen by [facility] providers o Does NOT accept Medicaid . [place], MD; [place], [place]; [place], [place], [place], [place] [address] o [phone] o Mon-Fri 8:00-5:00 o Babies seen by [facility] providers o Accepting Medicaid . Lamont, MD; [place], [place]; [place], NP; [place], [place], [place], [place] [address] o [phone] o Mon-Fri 8:00-5:00 o Babies seen by providers at [facility] [place] [phone]) . [facility] [place], [place] o [address] [place], [place] [address] o [phone] o Mon-Fri 8:00-5:00 o Babies seen by [facility] providers o Does NOT accept Medicaid o Limited availability, please call early in hospitalization to schedule follow-up . [facility] Leilani Merl, PA; Maisie Fus, MD; [place], MD; [place], [place]; Jeannine Kitten, MD; [place], [place] [facility] [address], [place], [place] [address] o [phone] o Mon-Fri 8:30-5:00, Sat 9:00-12:00 o Babies seen by providers at [facility] o Accepting Medicaid o Please register online then schedule online or call office o [email or web address] . [place] [facility]  [place]) Kristian Covey, NP; Dwyane Dee, MD; Leonidas Romberg, PA o [address] [place], [place], [place] [address] o [phone] o Mon-Fri 8:00-5:00 o Babies seen by providers at [facility] o Accepting Medicaid . [place] [facility]) Dairl Ponder, MD; Rayvon Char, NP; Melina Modena, MD o [address] [place], [place], [place] [address] o [phone] o Mon-Fri 8:00-5:30, Sat&Sun by appointment (phones open at 8:30) o Babies seen by [facility] providers o Accepting Medicaid o Must be a first-time baby or sibling of current patient . [place], [address], [place], [place]  [address] o [phone]   Fax - [phone]  [place] [phone] & [phone]) . [place], [place]; [place], [place]; Benjamine Mola, MD; [place], [place]; Harrell Lark, MD o [address] [place], [place] [address] o [phone] o Mon-Thur 8:00-7:00, Fri 8:00-5:00, Sat 8:00-12:00, Sun 9:00-12:00 o Babies seen by [facility] providers o Accepting Medicaid . [facility] at [facility], MD; Ruthann Cancer, MD; [facility], MD o [address], [place], [place] [address] o [phone] o Mon-Thur 8:00-5:00 o Babies seen by providers at [facility] o Accepting Medicaid . [facility] [place], MD; Coe[place], MD; Amedeo Plenty, MD; Bobby Rumpf, MD; List, MD; Lavonia Drafts, MD; Ruthann Cancer, MD; Selinda Eon, MD; Audie Box, MD; Jim Like, MD; Christie Nottingham, MD; Hubbard Hartshorn, MD; Modena Nunnery, MD o [address] [place], [place]  [address] o [phone] o Mon-Fri 8:00-5:30, Sat (Oct.-Mar.) 9:00-1:00 o Babies seen by providers at [facility] o Accepting Medicaid o Must fill out new patient packet, available online at [email or web address] . [facility] [place] [facility]) Simone Curia, NP; Tiburcio Pea, NP; Tresa Endo, NP; Whitney Post, MD;  [place], [place]; Hennie Duos, MD; Wynne Dust, MD; Kavin Leech, NP o [address], [place], [place] [address] o [phone] o Mon-Thur 8:00-5:30, Fri 8:00-5:00 o Babies seen by providers at [facility] o Accepting [facility] [phone]) . [facility] o [place], [place]; Maeser, MD; Tanya Nones, MD; [place], [place] o [address] [place], [place] [address] o [phone] o Mon-Fri 8:00-5:00 o Babies seen by providers at [facility] o Accepting [facility] [place] [phone]) . [facility] o [place], DO; Lenise Arena, MD; [place], [place] o [address], [place], [place] [address] o [phone] o Mon-Fri 8:00-5:00 o Babies seen by providers at [facility] o Does NOT accept Medicaid o Limited appointment availability, please call early in hospitalization  . Nature conservation officer at [facility] o [place], DO; [place], MD o [address] [place], [place] [address] o [phone] o Mon-Fri 8:00-5:00 o Babies seen by [facility] providers o Does NOT accept Medicaid . [facility] Lorrine Kin, MD; Ninetta Lights, MD; [place], [place]; [place], MD o [address] [facility], [place], [place] [address] o [phone] o Mon-Fri 8:00-5:00 o After hours clinic [facility] [place], [place] [address]) [phone] Mon-Fri 5:00-8:00, Sat 12:00-6:00, Sun 10:00-4:00 o Babies seen by [facility] providers o Accepting Medicaid . [facility] o [address], [place], [place]  [address] o [phone]   Fax - [phone]  [place] [phone]) . Nature conservation officer at [address] [facility], MD o [address] [place], [place], [place] [address] o [phone] o Mon-Fri 8:00-5:00 o Babies seen by [facility] providers o Does NOT accept Medicaid . [facility] [place] [facility] [place]) Tomi Likens, MD o [address] [place] [address] [place], [place]  [address] o [phone] o Mon-Thur 8:00-7:00, Fri 8:00-5:00, Sat 8:00-12:00 o Babies seen by providers at [facility] o Accepting Medicaid - but does not have vaccinations in office (must be received elsewhere) o Limited availability, please call early in hospitalization  [place] ([address]) . [facility]  o Wyvonne Lenz, MD o [address], [place] [address] o [phone]  Fax [phone]   Fetal Movement Counts Patient Name: ________________________________________________ Patient Due Date: ____________________ What is a fetal movement count?  A fetal movement count is the number of times that you feel your baby move during a certain amount of time. This may also be called a fetal kick count. A fetal movement count is recommended for every pregnant woman. You may be asked to start counting fetal movements as early as week 28 of your pregnancy. Pay attention to when your baby is most active. You may notice your baby's sleep and wake cycles. You may also notice things that make your baby move more. You should do a fetal movement count:  When your baby is normally most active.  At the same time each day. A good time to count movements is while you are resting, after having something to eat and drink. How do I count fetal movements? 1. Find a quiet, comfortable area. Sit, or lie down on your  side. 2. Write down the date, the start time and stop time, and the number of movements that you felt between those two times. Take this information with you to your health care visits. 3. Write down your start time when you feel the first movement. 4. Count kicks, flutters, swishes, rolls, and jabs. You should feel at least 10 movements. 5. You may stop counting after you have felt 10 movements, or if you have been counting for 2 hours. Write down the stop time. 6. If you do not feel 10 movements in 2 hours, contact your health care provider for further instructions.  Your health care provider may want to do additional tests to assess your baby's well-being. Contact a health care provider if:  You feel fewer than 10 movements in 2 hours.  Your baby is not moving like he or she usually does. Date: ____________ Start time: ____________ Stop time: ____________ Movements: ____________ Date: ____________ Start time: ____________ Stop time: ____________ Movements: ____________ Date: ____________ Start time: ____________ Stop time: ____________ Movements: ____________ Date: ____________ Start time: ____________ Stop time: ____________ Movements: ____________ Date: ____________ Start time: ____________ Stop time: ____________ Movements: ____________ Date: ____________ Start time: ____________ Stop time: ____________ Movements: ____________ Date: ____________ Start time: ____________ Stop time: ____________ Movements: ____________ Date: ____________ Start time: ____________ Stop time: ____________ Movements: ____________ Date: ____________ Start time: ____________ Stop time: ____________ Movements: ____________ This information is not intended to replace advice given to you by your health care provider. Make sure you discuss any questions you have with your health care provider. Document Revised: 10/22/2018 Document Reviewed: 10/22/2018 Elsevier Patient Education  2020 Elsevier Inc.  [employer] of the uterus can occur throughout pregnancy, but they are not always a sign that you are in labor. You may have practice contractions called Braxton Hicks contractions. These false labor contractions are sometimes confused with true labor. What are Deberah Pelton contractions? Braxton Hicks contractions are tightening movements that occur in the muscles of the uterus before labor. Unlike true labor contractions, these contractions do not result in opening (dilation) and thinning of the cervix. Toward the end of pregnancy (32-34 weeks), Braxton  Hicks contractions can happen more often and may become stronger. These contractions are sometimes difficult to tell apart from true labor because they can be very uncomfortable. You should not feel embarrassed if you go to the hospital with false labor. Sometimes, the only way to tell if you are in true labor is for your health care provider to look for changes in the cervix. The health care provider will do a physical exam and may monitor your contractions. If you are not in true labor, the exam should show that your cervix is not dilating and your water has not broken. If there are no other health problems associated with your pregnancy, it is completely safe for you to be sent home with false labor. You may continue to have Braxton Hicks contractions until you go into true labor. How to tell the difference between true labor and false labor True labor  Contractions last 30-70 seconds.  Contractions become very regular.  Discomfort is usually felt in the top of the uterus, and it spreads to the lower abdomen and low back.  Contractions do not go away with walking.  Contractions usually become more intense and increase in frequency.  The cervix dilates and gets thinner. False labor  Contractions are usually shorter and not as strong as true labor contractions.  Contractions are usually  irregular.  Contractions are often felt in the front of the lower abdomen and in the groin.  Contractions may go away when you walk around or change positions while lying down.  Contractions get weaker and are shorter-lasting as time goes on.  The cervix usually does not dilate or become thin. Follow these instructions at home:   Take over-the-counter and prescription medicines only as told by your health care provider.  Keep up with your usual exercises and follow other instructions from your health care provider.  Eat and drink lightly if you think you are going into labor.  If Braxton Hicks  contractions are making you uncomfortable: ? Change your position from lying down or resting to walking, or change from walking to resting. ? Sit and rest in a tub of warm water. ? Drink enough fluid to keep your urine pale yellow. Dehydration may cause these contractions. ? Do slow and deep breathing several times an hour.  Keep all follow-up prenatal visits as told by your health care provider. This is important. Contact a health care provider if:  You have a fever.  You have continuous pain in your abdomen. Get help right away if:  Your contractions become stronger, more regular, and closer together.  You have fluid leaking or gushing from your vagina.  You pass blood-tinged mucus (bloody show).  You have bleeding from your vagina.  You have low back pain that you never had before.  You feel your baby's head pushing down and causing pelvic pressure.  Your baby is not moving inside you as much as it used to. Summary  Contractions that occur before labor are called Braxton Hicks contractions, false labor, or practice contractions.  Braxton Hicks contractions are usually shorter, weaker, farther apart, and less regular than true labor contractions. True labor contractions usually become progressively stronger and regular, and they become more frequent.  Manage discomfort from [facility] contractions by changing position, resting in a warm bath, drinking plenty of water, or practicing deep breathing. This information is not intended to replace advice given to you by your health care provider. Make sure you discuss any questions you have with your health care provider. Document Revised: 02/14/2017 Document Reviewed: 07/18/2016 Elsevier Patient Education  [place].

## 2019-04-15 LAB — CERVICOVAGINAL ANCILLARY ONLY
Comment: NEGATIVE
Trichomonas: POSITIVE — AB

## 2019-04-15 MED ORDER — METRONIDAZOLE 500 MG PO TABS: 2000.0000 mg | ORAL_TABLET | Freq: Once | ORAL | 0 refills | Status: AC

## 2019-04-15 NOTE — Addendum Note (Signed)
Addended by: Marny Lowenstein on: 04/15/2019 01:15 PM   Modules accepted: Orders

## 2019-04-16 ENCOUNTER — Telehealth: Payer: Self-pay | Admitting: General Practice

## 2019-04-16 NOTE — Telephone Encounter (Signed)
Called patient and informed her of results. Discussed importance of partner treatment and abstaining from intercourse until after delivery otherwise there is risk of exposure to baby. Patient verbalized understanding & had no questions.

## 2019-04-16 NOTE — Telephone Encounter (Signed)
-----   Message from Marny Lowenstein, PA-C sent at 04/15/2019  1:15 PM EST ----- TOC for Trich last treated in October was positive. Rx sent. Please call patient and inform her of the importance of treatment ASAP since she is [redacted]w[redacted]d. Partner treatment advised and no intercourse before delivery.   Marny Lowenstein, PA-C 04/15/2019 1:14 PM

## 2019-04-18 ENCOUNTER — Other Ambulatory Visit: Payer: Self-pay | Admitting: Family Medicine

## 2019-04-18 LAB — URINE CULTURE, OB REFLEX

## 2019-04-18 LAB — CULTURE, OB URINE

## 2019-04-18 MED ORDER — CEPHALEXIN 500 MG PO CAPS: 500.0000 mg | ORAL_CAPSULE | Freq: Four times a day (QID) | ORAL | 0 refills | Status: DC

## 2019-04-18 NOTE — Addendum Note (Signed)
Addended by: Marny Lowenstein on: 04/18/2019 11:05 AM   Modules accepted: Orders

## 2019-04-19 ENCOUNTER — Telehealth (HOSPITAL_COMMUNITY): Payer: Self-pay | Admitting: *Deleted

## 2019-04-19 NOTE — Telephone Encounter (Signed)
Preadmission screen  

## 2019-04-20 ENCOUNTER — Telehealth (HOSPITAL_COMMUNITY): Payer: Self-pay | Admitting: *Deleted

## 2019-04-20 NOTE — Telephone Encounter (Signed)
Preadmission screen  

## 2019-04-21 ENCOUNTER — Telehealth (HOSPITAL_COMMUNITY): Payer: Self-pay | Admitting: *Deleted

## 2019-04-21 NOTE — Telephone Encounter (Signed)
Preadmission screen  

## 2019-04-22 ENCOUNTER — Ambulatory Visit: Payer: Self-pay

## 2019-04-22 ENCOUNTER — Ambulatory Visit (INDEPENDENT_AMBULATORY_CARE_PROVIDER_SITE_OTHER): Payer: Medicaid Other | Admitting: *Deleted

## 2019-04-22 ENCOUNTER — Other Ambulatory Visit (HOSPITAL_COMMUNITY): Payer: Self-pay | Admitting: Advanced Practice Midwife

## 2019-04-22 ENCOUNTER — Other Ambulatory Visit: Payer: Self-pay

## 2019-04-22 ENCOUNTER — Other Ambulatory Visit (HOSPITAL_COMMUNITY)
Admission: RE | Admit: 2019-04-22 | Discharge: 2019-04-22 | Disposition: A | Discharge status: Home / Self Care | Payer: Medicaid Other | Source: Ambulatory Visit | Attending: Family Medicine | Admitting: Family Medicine

## 2019-04-22 ENCOUNTER — Ambulatory Visit (INDEPENDENT_AMBULATORY_CARE_PROVIDER_SITE_OTHER): Payer: Medicaid Other | Admitting: Obstetrics and Gynecology

## 2019-04-22 VITALS — BP 116/68 | HR 85 | Wt 196.7 lb

## 2019-04-22 DIAGNOSIS — O480 Post-term pregnancy: Secondary | ICD-10-CM

## 2019-04-22 DIAGNOSIS — Z3480 Encounter for supervision of other normal pregnancy, unspecified trimester: Secondary | ICD-10-CM

## 2019-04-22 DIAGNOSIS — Z20822 Contact with and (suspected) exposure to covid-19: Secondary | ICD-10-CM | POA: Diagnosis not present

## 2019-04-22 DIAGNOSIS — Z3A40 40 weeks gestation of pregnancy: Secondary | ICD-10-CM | POA: Diagnosis not present

## 2019-04-22 DIAGNOSIS — Z01812 Encounter for preprocedural laboratory examination: Secondary | ICD-10-CM | POA: Diagnosis present

## 2019-04-22 LAB — SARS CORONAVIRUS 2 (TAT 6-24 HRS): SARS Coronavirus 2: NEGATIVE

## 2019-04-22 NOTE — Progress Notes (Signed)
IOL scheduled on 2/6.   Pt desires Cx exam today.

## 2019-04-22 NOTE — Progress Notes (Signed)
Patient's appointment at 2:55 today. At 3:00 provider went to room and the patient had walked out w/o being seen.   Reviewed NST after she left. Basline 140, 15x15 accels, no decels. Occasional UI  Nadia Viar, Harolyn Rutherford, NP 04/22/2019 3:07 PM

## 2019-04-24 ENCOUNTER — Encounter (HOSPITAL_COMMUNITY): Payer: Self-pay | Admitting: Obstetrics and Gynecology

## 2019-04-24 ENCOUNTER — Inpatient Hospital Stay (HOSPITAL_COMMUNITY): Payer: Medicaid Other | Admitting: Anesthesiology

## 2019-04-24 ENCOUNTER — Inpatient Hospital Stay (HOSPITAL_COMMUNITY)
Admission: AD | Admit: 2019-04-24 | Discharge: 2019-04-26 | DRG: 807 | Disposition: A | Discharge status: Home / Self Care | Payer: Medicaid Other | Attending: Obstetrics and Gynecology | Admitting: Obstetrics and Gynecology

## 2019-04-24 ENCOUNTER — Other Ambulatory Visit: Payer: Self-pay

## 2019-04-24 ENCOUNTER — Inpatient Hospital Stay (HOSPITAL_COMMUNITY): Payer: Medicaid Other

## 2019-04-24 DIAGNOSIS — F17210 Nicotine dependence, cigarettes, uncomplicated: Secondary | ICD-10-CM | POA: Diagnosis present

## 2019-04-24 DIAGNOSIS — Z3A41 41 weeks gestation of pregnancy: Secondary | ICD-10-CM

## 2019-04-24 DIAGNOSIS — Z20822 Contact with and (suspected) exposure to covid-19: Secondary | ICD-10-CM | POA: Diagnosis present

## 2019-04-24 DIAGNOSIS — O480 Post-term pregnancy: Secondary | ICD-10-CM | POA: Diagnosis present

## 2019-04-24 DIAGNOSIS — O99311 Alcohol use complicating pregnancy, first trimester: Secondary | ICD-10-CM

## 2019-04-24 DIAGNOSIS — A5901 Trichomonal vulvovaginitis: Secondary | ICD-10-CM | POA: Diagnosis present

## 2019-04-24 DIAGNOSIS — O99334 Smoking (tobacco) complicating childbirth: Secondary | ICD-10-CM | POA: Diagnosis present

## 2019-04-24 DIAGNOSIS — O2340 Unspecified infection of urinary tract in pregnancy, unspecified trimester: Secondary | ICD-10-CM | POA: Diagnosis present

## 2019-04-24 DIAGNOSIS — O2341 Unspecified infection of urinary tract in pregnancy, first trimester: Secondary | ICD-10-CM | POA: Diagnosis present

## 2019-04-24 DIAGNOSIS — Z3480 Encounter for supervision of other normal pregnancy, unspecified trimester: Secondary | ICD-10-CM

## 2019-04-24 LAB — CBC
HCT: 33.4 % — ABNORMAL LOW (ref 36.0–46.0)
Hemoglobin: 10.7 g/dL — ABNORMAL LOW (ref 12.0–15.0)
MCH: 29.3 pg (ref 26.0–34.0)
MCHC: 32 g/dL (ref 30.0–36.0)
MCV: 91.5 fL (ref 80.0–100.0)
Platelets: 187 10*3/uL (ref 150–400)
RBC: 3.65 MIL/uL — ABNORMAL LOW (ref 3.87–5.11)
RDW: 14.1 % (ref 11.5–15.5)
WBC: 11 10*3/uL — ABNORMAL HIGH (ref 4.0–10.5)
nRBC: 0 % (ref 0.0–0.2)

## 2019-04-24 LAB — RPR: RPR Ser Ql: NONREACTIVE

## 2019-04-24 MED ORDER — MISOPROSTOL 25 MCG QUARTER TABLET
25.0000 ug | ORAL_TABLET | ORAL | Status: DC | PRN
  Filled 2019-04-24: qty 1

## 2019-04-24 MED ORDER — NITROFURANTOIN MONOHYD MACRO 100 MG PO CAPS
100.0000 mg | ORAL_CAPSULE | Freq: Two times a day (BID) | ORAL | Status: DC
  Administered 2019-04-24 – 2019-04-26 (×5): 100 mg via ORAL
  Filled 2019-04-24 (×5): qty 1

## 2019-04-24 MED ORDER — DIPHENHYDRAMINE HCL 50 MG/ML IJ SOLN: 12.5000 mg | INTRAMUSCULAR | Status: DC | PRN

## 2019-04-24 MED ORDER — LIDOCAINE HCL (PF) 1 % IJ SOLN: 30.0000 mL | INTRAMUSCULAR | Status: DC | PRN

## 2019-04-24 MED ORDER — OXYTOCIN 40 UNITS IN NORMAL SALINE INFUSION - SIMPLE MED
2.5000 [IU]/h | INTRAVENOUS | Status: DC
  Administered 2019-04-25: 2.5 [IU]/h via INTRAVENOUS
  Filled 2019-04-24: qty 1000

## 2019-04-24 MED ORDER — FENTANYL-BUPIVACAINE-NACL 0.5-0.125-0.9 MG/250ML-% EP SOLN
12.0000 mL/h | EPIDURAL | Status: DC | PRN
  Filled 2019-04-24: qty 250

## 2019-04-24 MED ORDER — PHENYLEPHRINE 40 MCG/ML (10ML) SYRINGE FOR IV PUSH (FOR BLOOD PRESSURE SUPPORT): 80.0000 ug | PREFILLED_SYRINGE | INTRAVENOUS | Status: DC | PRN

## 2019-04-24 MED ORDER — PHENYLEPHRINE 40 MCG/ML (10ML) SYRINGE FOR IV PUSH (FOR BLOOD PRESSURE SUPPORT)
80.0000 ug | PREFILLED_SYRINGE | INTRAVENOUS | Status: DC | PRN
  Filled 2019-04-24: qty 10

## 2019-04-24 MED ORDER — OXYTOCIN BOLUS FROM INFUSION
500.0000 mL | Freq: Once | INTRAVENOUS | Status: AC
  Administered 2019-04-24: 500 mL via INTRAVENOUS

## 2019-04-24 MED ORDER — OXYTOCIN 40 UNITS IN NORMAL SALINE INFUSION - SIMPLE MED
1.0000 m[IU]/min | INTRAVENOUS | Status: DC
  Administered 2019-04-24: 2 m[IU]/min via INTRAVENOUS

## 2019-04-24 MED ORDER — OXYCODONE-ACETAMINOPHEN 5-325 MG PO TABS: 2.0000 | ORAL_TABLET | ORAL | Status: DC | PRN

## 2019-04-24 MED ORDER — LIDOCAINE HCL (PF) 1 % IJ SOLN
INTRAMUSCULAR | Status: DC | PRN
  Administered 2019-04-24: 6 mL via EPIDURAL

## 2019-04-24 MED ORDER — TERBUTALINE SULFATE 1 MG/ML IJ SOLN: 0.2500 mg | Freq: Once | INTRAMUSCULAR | Status: DC | PRN

## 2019-04-24 MED ORDER — LACTATED RINGERS IV SOLN
500.0000 mL | Freq: Once | INTRAVENOUS | Status: AC
  Administered 2019-04-24: 500 mL via INTRAVENOUS

## 2019-04-24 MED ORDER — OXYCODONE-ACETAMINOPHEN 5-325 MG PO TABS: 1.0000 | ORAL_TABLET | ORAL | Status: DC | PRN

## 2019-04-24 MED ORDER — EPHEDRINE 5 MG/ML INJ: 10.0000 mg | INTRAVENOUS | Status: DC | PRN

## 2019-04-24 MED ORDER — SOD CITRATE-CITRIC ACID 500-334 MG/5ML PO SOLN: 30.0000 mL | ORAL | Status: DC | PRN

## 2019-04-24 MED ORDER — ACETAMINOPHEN 325 MG PO TABS: 650.0000 mg | ORAL_TABLET | ORAL | Status: DC | PRN

## 2019-04-24 MED ORDER — LACTATED RINGERS IV SOLN: INTRAVENOUS | Status: DC

## 2019-04-24 MED ORDER — LACTATED RINGERS IV SOLN: 500.0000 mL | INTRAVENOUS | Status: DC | PRN

## 2019-04-24 MED ORDER — MISOPROSTOL 50MCG HALF TABLET: 50.0000 ug | ORAL_TABLET | ORAL | Status: DC | PRN

## 2019-04-24 MED ORDER — ONDANSETRON HCL 4 MG/2ML IJ SOLN
4.0000 mg | Freq: Four times a day (QID) | INTRAMUSCULAR | Status: DC | PRN
  Administered 2019-04-24: 4 mg via INTRAVENOUS
  Filled 2019-04-24: qty 2

## 2019-04-24 MED ORDER — FENTANYL CITRATE (PF) 100 MCG/2ML IJ SOLN
100.0000 ug | INTRAMUSCULAR | Status: DC | PRN
  Administered 2019-04-24 (×2): 100 ug via INTRAVENOUS
  Filled 2019-04-24 (×2): qty 2

## 2019-04-24 MED ORDER — SODIUM CHLORIDE (PF) 0.9 % IJ SOLN
INTRAMUSCULAR | Status: DC | PRN
  Administered 2019-04-24: 12 mL/h via EPIDURAL

## 2019-04-24 NOTE — Anesthesia Preprocedure Evaluation (Addendum)
Anesthesia Evaluation  Patient identified by MRN, date of birth, ID band Patient awake    Reviewed: Allergy & Precautions, H&P , NPO status , Patient's Chart, lab work & pertinent test results, reviewed documented beta blocker date and time   Airway Mallampati: I  TM Distance: >3 FB Neck ROM: full    Dental no notable dental hx. (+) Teeth Intact, Dental Advisory Given   Pulmonary neg pulmonary ROS, Current Smoker,    Pulmonary exam normal breath sounds clear to auscultation       Cardiovascular negative cardio ROS Normal cardiovascular exam Rhythm:regular Rate:Normal     Neuro/Psych negative neurological ROS  negative psych ROS   GI/Hepatic negative GI ROS, Neg liver ROS,   Endo/Other  negative endocrine ROS  Renal/GU negative Renal ROS  negative genitourinary   Musculoskeletal   Abdominal   Peds  Hematology negative hematology ROS (+)   Anesthesia Other Findings   Reproductive/Obstetrics (+) Pregnancy                            Anesthesia Physical Anesthesia Plan  ASA: II  Anesthesia Plan: Epidural   Post-op Pain Management:    Induction:   PONV Risk Score and Plan:   Airway Management Planned:   Additional Equipment:   Intra-op Plan:   Post-operative Plan:   Informed Consent: I have reviewed the patients History and Physical, chart, labs and discussed the procedure including the risks, benefits and alternatives for the proposed anesthesia with the patient or authorized representative who has indicated his/her understanding and acceptance.       Plan Discussed with: Anesthesiologist  Anesthesia Plan Comments:         Anesthesia Quick Evaluation

## 2019-04-24 NOTE — Progress Notes (Signed)
LABOR PROGRESS NOTE  Kristin Brock is a 24 y.o. G2P1001 at [redacted]w[redacted]d  admitted for PD IOL.  Subjective: Comfortable w epidural  Objective: BP 117/65   Pulse 66   Temp 98.1 F (36.7 C) (Oral)   Resp 17   Ht 5\' 4"  (1.626 m)   Wt 88.9 kg   LMP 07/06/2018 (Approximate)   SpO2 100%   BMI 33.64 kg/m  or  Vitals:   04/24/19 1701 04/24/19 1731 04/24/19 1758 04/24/19 1801  BP: (!) 107/57 110/63  117/65  Pulse: 66 69  66  Resp:      Temp:   98.1 F (36.7 C)   TempSrc:   Oral   SpO2:      Weight:      Height:         Dilation: 6 Effacement (%): 70 Station: -1, -2 Presentation: Vertex Exam by:: [email or web address], MD FHT: baseline rate 135, moderate varibility, +acel, mix of early and variable decel Toco: q2-3 min  Labs: Lab Results  Component Value Date   WBC 11.0 (H) 04/24/2019   HGB 10.7 (L) 04/24/2019   HCT 33.4 (L) 04/24/2019   MCV 91.5 04/24/2019   PLT 187 04/24/2019    Patient Active Problem List   Diagnosis Date Noted  . Post-dates pregnancy 04/24/2019  . UTI (urinary tract infection) in pregnancy in first trimester 10/11/2018  . Trichomonal vaginitis during pregnancy in first trimester 10/08/2018  . 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 Supervision of other normal pregnancy, antepartum 08/25/2018  . Subchorionic hemorrhage in first trimester 08/25/2018    Assessment / Plan: 24 y.o. G2P1001 at [redacted]w[redacted]d here for PD IOL.  Labor: s/p AROM for clear @1745 , making good progress on pitocin Fetal Wellbeing:  Cat II for variables but reassuring for moderate variability Pain Control:  epidural GBS: neg Anticipated MOD:  SVD  EtOH use in first trimester: no use since first trimester per chart, fetal echo normal  Trichomonas: reports treatment for positive result from 04/14/2019 and no intercourse since then, will need outpatient TOC in 1-2 weeks  Persistent bacteriuria w E.coli: last +04/14/2019, will start treatment with 04/16/2019,  MD/MPH OB Fellow  04/24/2019, 6:46 PM

## 2019-04-24 NOTE — Progress Notes (Signed)
SVE unchanged, fetal heart rate Cat I.  Will restart pitocin.  Anticipate vaginal delivery.  Marlowe Alt, DO OB Fellow, Faculty [facility] 04/24/2019 10:52 PM

## 2019-04-24 NOTE — H&P (Signed)
LABOR AND DELIVERY ADMISSION HISTORY AND PHYSICAL NOTE  Kristin Brock is a 24 y.o. female G2P1001 with IUP at 13w0dby 6wk UKoreapresenting for PD IOL.   She reports positive fetal movement. She denies leakage of fluid, vaginal bleeding, or contractions.   She plans on bottle feeding. Her contraception plan is: unsure, leaning towards pills.  [facility] History[facility]Com[facility] , CWD, normal anatomy, cephalic presentation, anterior placenta, 24%ile, EFW 2671 grams  Pregnancy complications:  - EtOH use in first trimester, fetal echo normal - Trichomonas, +04/14/2019 - Persistent bacteriuria w E.coli, last +04/14/2019  Past Medical History: Past Medical History:  Diagnosis Date  . Asthma     Past Surgical History: Past Surgical History:  Procedure Laterality Date  . NO PAST SURGERIES      Obstetrical History: OB History    Gravida  2   Para  1   Term  1   Preterm  0   AB  0   Living  1     SAB      TAB      Ectopic      Multiple      Live Births  1           Social History: Social History   Socioeconomic History  . Marital status: Single    Spouse name: Not on file  . Number of children: Not on file  . Years of education: Not on file  . Highest education level: Not on file  Occupational History  . Not on file  Tobacco Use  . Smoking status: Current Every Day Smoker    Packs/day: 0.25    Types: Cigarettes  . Smokeless tobacco: Never Used  Substance and Sexual Activity  . Alcohol use: Yes    Alcohol/week: 3.0 standard drinks    Types: 3 Glasses of wine per week    Comment: daily  . Drug use: Not Currently    Types: Marijuana    Comment: last used 2019  . Sexual activity: Yes  Other Topics Concern  . Not on file  Social History Narrative  . Not on file   Social Determinants of Health   Financial Resource Strain:   . Difficulty of Paying Living Expenses: Not on file  Food Insecurity: No Food Insecurity  . Worried  About RCharity fundraiserin the Last Year: Never true  . Ran Out of Food in the Last Year: Never true  Transportation Needs: Unmet Transportation Needs  . Lack of Transportation (Medical): Yes  . Lack of Transportation (Non-Medical): Yes  Physical Activity:   . Days of Exercise per Week: Not on file  . Minutes of Exercise per Session: Not on file  Stress:   . Feeling of Stress : Not on file  Social Connections:   . Frequency of Communication with Friends and Family: Not on file  . Frequency of Social Gatherings with Friends and Family: Not on file  . Attends Religious Services: Not on file  . Active Member of Clubs or Organizations: Not on file  . Attends CArchivistMeetings: Not on file  . Marital Status: Not on file    Family History: History reviewed. No pertinent family history.  Allergies: No Known Allergies  Medications Prior to Admission  Medication Sig Dispense Refill Last Dose  . Blood Pressure Monitoring (BLOOD PRESSURE KIT) DEVI 1 Device by Does not apply route daily. ICD 10: Z34.00 1 Device 0   . cephALEXin (  KEFLEX) 500 MG capsule Take 1 capsule (500 mg total) by mouth 4 (four) times daily. (Patient not taking: Reported on 04/22/2019) 28 capsule 0   . Prenatal MV & Min w/FA-DHA (PRENATAL ADULT GUMMY/DHA/FA) 0.4-25 MG CHEW Chew 2 tablets by mouth daily. (Patient not taking: Reported on 03/03/2019) 60 tablet 11   . Prenatal Vit-Fe Fumarate-FA (PRENATAL VITAMINS PO) Take 1 tablet by mouth daily.     . Prenatal Vit-Fe Fumarate-FA (PREPLUS) 27-1 MG TABS Take 1 tablet by mouth daily. (Patient not taking: Reported on 02/03/2019) 30 tablet 6      Review of Systems  All systems reviewed and negative except as stated in HPI  Physical Exam Blood pressure 122/68, pulse 80, temperature 97.9 F (36.6 C), temperature source Oral, resp. rate 16, height '5\' 4"'  (1.626 m), weight 88.9 kg, last menstrual period 07/06/2018. General appearance: alert, oriented, NAD Lungs:  normal respiratory effort Heart: regular rate Abdomen: soft, non-tender; gravid, leopolds 3400 g Extremities: No calf swelling or tenderness Presentation: cephalic by SVE  Fetal monitoringBaseline: 125 bpm, Variability: Good {> 6 bpm), Accelerations: Reactive and Decelerations: Absent Uterine activityFrequency: Every 3-4 minutes  Dilation: 2 Effacement (%): 60 Station: -2 Exam by:: Dr. Dione Plover  Prenatal labs: ABO, Rh: B/Positive/-- (07/22 1559) Antibody: Positive, See Final Results (07/22 1559) Rubella: 6.40 (07/22 1559) RPR: Non Reactive (11/18 0858)  HBsAg: Negative (07/22 1559)  HIV: Non Reactive (11/18 0858)  GC/Chlamydia: neg/neg 04/06/2019  GBS: Negative/-- (01/19 1650)  2-hr GTT: normal 02/03/2019 Genetic screening:  Not done Anatomy US: normal  Prenatal Transfer Tool  Maternal Diabetes: No Genetic Screening: Declined Maternal Ultrasounds/Referrals: Normal Fetal Ultrasounds or other Referrals:  Fetal echo Maternal Substance Abuse:  Yes:  Type: Other: EtOH first trimester, none currently Significant Maternal Medications:  None Significant Maternal Lab Results: Group B Strep negative and Other: +trich and +UCx on 04/14/2019  Results for orders placed or performed during the hospital encounter of 04/24/19 (from the past 24 hour(s))  CBC   Collection Time: 04/24/19  7:59 AM  Result Value Ref Range   WBC 11.0 (H) 4.0 - 10.5 K/uL   RBC 3.65 (L) 3.87 - 5.11 MIL/uL   Hemoglobin 10.7 (L) 12.0 - 15.0 g/dL   HCT 33.4 (L) 36.0 - 46.0 %   MCV 91.5 80.0 - 100.0 fL   MCH 29.3 26.0 - 34.0 pg   MCHC 32.0 30.0 - 36.0 g/dL   RDW 14.1 11.5 - 15.5 %   Platelets 187 150 - 400 K/uL   nRBC 0.0 0.0 - 0.2 %    Patient Active Problem List   Diagnosis Date Noted  . Post-dates pregnancy 04/24/2019  . UTI (urinary tract infection) in pregnancy in first trimester 10/11/2018  . Trichomonal vaginitis during pregnancy in first trimester 10/08/2018  . Alcohol use affecting pregnancy in  first trimester 08/31/2018  . Supervision of other normal pregnancy, antepartum 08/25/2018  . Subchorionic hemorrhage in first trimester 08/25/2018    Assessment: Kristin Brock is a 24 y.o. G2P1001 at 30w0dhere for PD IOL.   #Labor: FB placed at first exam, will wait to see if Miso possible, contracting too often at present #Pain: IV pain meds PRN, epidural upon request #FWB: Cat I #GBS/ID: neg #COVID: swab negative 04/22/2019 #MOF: Bottle #MOC: Unsure #Circ: outpatient  #EtOH use in first trimester: no use since first trimester per chart, fetal echo normal  #Trichomonas: reports treatment for positive result from 04/14/2019, will need outpatient TOC in 1-2 weeks  #Persistent bacteriuria w  E.coli: last +04/14/2019, will start treatment with Bonna [facility] 04/24/2019, 10:39 AM

## 2019-04-24 NOTE — Progress Notes (Addendum)
At bedside due to continue variables v early decels.  Patient's SVE: anterior lip/100/0  IUPC placed with amnioinfusion. Pitocin off at this point.  Anticipate vaginal delivery soon.  Marlowe Alt, DO OB Fellow, Faculty Practice 04/24/2019 9:27 PM

## 2019-04-24 NOTE — Anesthesia Procedure Notes (Signed)
Epidural Patient location during procedure: OB Start time: 04/24/2019 3:37 PM End time: 04/24/2019 3:40 PM  Preanesthetic Checklist Completed: patient identified, IV checked, site marked, risks and benefits discussed, surgical consent, monitors and equipment checked, pre-op evaluation and timeout performed  Epidural Patient position: sitting Prep: DuraPrep and site prepped and draped Patient monitoring: continuous pulse ox and blood pressure Approach: midline Injection technique: LOR air  Needle:  Needle type: Tuohy  Needle gauge: 17 G Needle length: 9 cm and 9 Needle insertion depth: 7 cm Catheter type: closed end flexible Catheter size: 19 Gauge Catheter at skin depth: 12 cm Test dose: negative  Assessment Events: blood not aspirated, injection not painful, no injection resistance, no paresthesia and negative IV test

## 2019-04-24 NOTE — Progress Notes (Signed)
LABOR PROGRESS NOTE  Kristin Brock is a 24 y.o. G2P1001 at [redacted]w[redacted]d  admitted for PD IOL.  Subjective: Starting to feel contractions very strongly  Objective: BP (!) 83/64 (BP Location: Right Arm)   Pulse 63   Temp 98.1 F (36.7 C) (Oral)   Resp 17   Ht 5\' 4"  (1.626 m)   Wt 88.9 kg   LMP 07/06/2018 (Approximate)   BMI 33.64 kg/m  or  Vitals:   04/24/19 1301 04/24/19 1334 04/24/19 1406 04/24/19 1502  BP: (!) 102/45 (!) 96/41 (!) 108/42 (!) 83/64  Pulse: 70 72 71 63  Resp: 15 16 17    Temp:      TempSrc:      Weight:      Height:         Dilation: 4.5 Effacement (%): 60 Station: -2 Presentation: Vertex Exam by:: , MD FHT: baseline rate 135, moderate varibility, +acel, -decel Toco: q2-3 min  Labs: Lab Results  Component Value Date   WBC 11.0 (H) 04/24/2019   HGB 10.7 (L) 04/24/2019   HCT 33.4 (L) 04/24/2019   MCV 91.5 04/24/2019   PLT 187 04/24/2019    Patient Active Problem List   Diagnosis Date Noted  . Post-dates pregnancy 04/24/2019  . UTI (urinary tract infection) in pregnancy in first trimester 10/11/2018  . Trichomonal vaginitis during pregnancy in first trimester 10/08/2018  . 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 Supervision of other normal pregnancy, antepartum 08/25/2018  . Subchorionic hemorrhage in first trimester 08/25/2018    Assessment / Plan: 24 y.o. G2P1001 at [redacted]w[redacted]d here for PD IOL.  Labor: making progress on pitocin, would like to get epidural and then she is amenable to AROM Fetal Wellbeing:  Cat I Pain Control:  epidural GBS: neg Anticipated MOD:  SVD  EtOH use in first trimester: no use since first trimester per chart, fetal echo normal  Trichomonas: reports treatment for positive result from 04/14/2019 and no intercourse since then, will need outpatient TOC in 1-2 weeks  Persistent bacteriuria w E.coli: last +04/14/2019, will start treatment with 04/16/2019, MD/MPH OB Fellow   04/24/2019, 3:10 PM

## 2019-04-25 ENCOUNTER — Encounter (HOSPITAL_COMMUNITY): Payer: Self-pay | Admitting: Obstetrics and Gynecology

## 2019-04-25 DIAGNOSIS — Z3A41 41 weeks gestation of pregnancy: Secondary | ICD-10-CM

## 2019-04-25 DIAGNOSIS — O480 Post-term pregnancy: Secondary | ICD-10-CM

## 2019-04-25 LAB — CBC
HCT: 28.8 % — ABNORMAL LOW (ref 36.0–46.0)
Hemoglobin: 9.3 g/dL — ABNORMAL LOW (ref 12.0–15.0)
MCH: 29.2 pg (ref 26.0–34.0)
MCHC: 32.3 g/dL (ref 30.0–36.0)
MCV: 90.6 fL (ref 80.0–100.0)
Platelets: 158 10*3/uL (ref 150–400)
RBC: 3.18 MIL/uL — ABNORMAL LOW (ref 3.87–5.11)
RDW: 14.3 % (ref 11.5–15.5)
WBC: 15 10*3/uL — ABNORMAL HIGH (ref 4.0–10.5)
nRBC: 0.1 % (ref 0.0–0.2)

## 2019-04-25 MED ORDER — TETANUS-DIPHTH-ACELL PERTUSSIS 5-2.5-18.5 LF-MCG/0.5 IM SUSP: 0.5000 mL | Freq: Once | INTRAMUSCULAR | Status: DC

## 2019-04-25 MED ORDER — IBUPROFEN 600 MG PO TABS
600.0000 mg | ORAL_TABLET | Freq: Four times a day (QID) | ORAL | Status: DC
  Administered 2019-04-25 – 2019-04-26 (×6): 600 mg via ORAL
  Filled 2019-04-25 (×5): qty 1

## 2019-04-25 MED ORDER — ZOLPIDEM TARTRATE 5 MG PO TABS: 5.0000 mg | ORAL_TABLET | Freq: Every evening | ORAL | Status: DC | PRN

## 2019-04-25 MED ORDER — BENZOCAINE-MENTHOL 20-0.5 % EX AERO: 1.0000 "application " | INHALATION_SPRAY | CUTANEOUS | Status: DC | PRN

## 2019-04-25 MED ORDER — DIBUCAINE (PERIANAL) 1 % EX OINT: 1.0000 "application " | TOPICAL_OINTMENT | CUTANEOUS | Status: DC | PRN

## 2019-04-25 MED ORDER — MEDROXYPROGESTERONE ACETATE 150 MG/ML IM SUSP: 150.0000 mg | INTRAMUSCULAR | Status: DC | PRN

## 2019-04-25 MED ORDER — ACETAMINOPHEN 325 MG PO TABS
650.0000 mg | ORAL_TABLET | ORAL | Status: DC | PRN
  Administered 2019-04-25 – 2019-04-26 (×4): 650 mg via ORAL
  Filled 2019-04-25 (×4): qty 2

## 2019-04-25 MED ORDER — SENNOSIDES-DOCUSATE SODIUM 8.6-50 MG PO TABS
2.0000 | ORAL_TABLET | ORAL | Status: DC
  Administered 2019-04-26: 2 via ORAL
  Filled 2019-04-25: qty 2

## 2019-04-25 MED ORDER — SIMETHICONE 80 MG PO CHEW: 80.0000 mg | CHEWABLE_TABLET | ORAL | Status: DC | PRN

## 2019-04-25 MED ORDER — OXYCODONE HCL 5 MG PO TABS
10.0000 mg | ORAL_TABLET | ORAL | Status: DC | PRN
  Administered 2019-04-25 – 2019-04-26 (×2): 10 mg via ORAL
  Filled 2019-04-25 (×2): qty 2

## 2019-04-25 MED ORDER — DIPHENHYDRAMINE HCL 25 MG PO CAPS: 25.0000 mg | ORAL_CAPSULE | Freq: Four times a day (QID) | ORAL | Status: DC | PRN

## 2019-04-25 MED ORDER — PRENATAL MULTIVITAMIN CH
1.0000 | ORAL_TABLET | Freq: Every day | ORAL | Status: DC
  Administered 2019-04-25: 1 via ORAL

## 2019-04-25 MED ORDER — ONDANSETRON HCL 4 MG PO TABS: 4.0000 mg | ORAL_TABLET | ORAL | Status: DC | PRN

## 2019-04-25 MED ORDER — WITCH HAZEL-GLYCERIN EX PADS: 1.0000 "application " | MEDICATED_PAD | CUTANEOUS | Status: DC | PRN

## 2019-04-25 MED ORDER — OXYCODONE HCL 5 MG PO TABS
5.0000 mg | ORAL_TABLET | ORAL | Status: DC | PRN
  Administered 2019-04-25 – 2019-04-26 (×2): 5 mg via ORAL
  Filled 2019-04-25 (×2): qty 1

## 2019-04-25 MED ORDER — COCONUT OIL OIL: 1.0000 "application " | TOPICAL_OIL | Status: DC | PRN

## 2019-04-25 MED ORDER — FERROUS SULFATE 325 (65 FE) MG PO TABS
325.0000 mg | ORAL_TABLET | Freq: Every day | ORAL | Status: DC
  Administered 2019-04-26: 325 mg via ORAL
  Filled 2019-04-25: qty 1

## 2019-04-25 MED ORDER — ONDANSETRON HCL 4 MG/2ML IJ SOLN: 4.0000 mg | INTRAMUSCULAR | Status: DC | PRN

## 2019-04-25 NOTE — Progress Notes (Addendum)
POSTPARTUM PROGRESS NOTE  Subjective: Kristin Brock is a 24 y.o. [id number] s/p VD at [redacted]w[redacted]d for IOL d/t PD.  She reports she doing well. No acute events overnight. Complains of some weakness with ambulation after her epidural. Also states she has a decreased appetite, but has been able to eat a few crackers this morning. She denies any problems with voiding. Denies nausea or vomiting. She has passed flatus. Pain is well controlled. Lochia is minimal.  Objective: Blood pressure 106/65, pulse 69, temperature 98.8 F (37.1 C), temperature source Oral, resp. rate 17, height 5\' 4"  (1.626 m), weight 88.9 kg, last menstrual period 07/06/2018, SpO2 100 %, unknown if currently breastfeeding.  Physical Exam:  General: alert, cooperative and no distress Chest: no respiratory distress, breath sounds clear throughout, regular heart rate Abdomen: soft, non-tender  Uterine Fundus: firm, appropriately tender Extremities: No calf swelling or tenderness  no pedal edema  Recent Labs    04/24/19 0759 04/25/19 0449  HGB 10.7* 9.3*  HCT 33.4* 28.8*    Assessment/Plan: Kristin Brock is a 24 y.o. 36 s/p VD at [redacted]w[redacted]d for IOL d/t PD.  Routine Postpartum Care: Doing well, pain well-controlled.  -- Continue routine care, lactation support  -- Contraception: Depo -- Feeding: On normal diet, but decreased appetite as of this morning -- Ambulating: Has some weakness post-epidural. Pt encouraged to increase ambulation as weakness subsides.   Low hemoglobin: Dropped from 10.7 to 9.5. Has required iron during prior pregnancies. Denies dizziness or fatigue.  -- Start oral iron    Persistent bacteriuria w E.coli during pregnancy: Receiving Macrobid -- UCx TOC as outpatient  Trichomonas during pregnancy:  -- Trich TOC as outpatient  Dispo: Plan for discharge on 2/8 if hemoglobin remains stable and pt continues without complaints or complications.  Faris Almubaslat, Ms3 04/25/2019, 8:02 AM   I repeated the  exam and agree with above. Pt reports that leg weakness resolved. Ambulating w/out difficulty. Slight decreased sensation in calves. Undecided RE: [facility] now. Declines Depo IP. Contraceptive counseling done.   [facility] Kristin Brock, [place] 04/25/2019 12:09 PM

## 2019-04-25 NOTE — Discharge Instructions (Signed)

## 2019-04-25 NOTE — Anesthesia Postprocedure Evaluation (Signed)
Anesthesia Post Note  Patient: Emmamae Mcnamara  Procedure(s) Performed: AN AD HOC LABOR EPIDURAL     Patient location during evaluation: Mother Baby Anesthesia Type: Epidural Level of consciousness: awake and alert, oriented and patient cooperative Pain management: pain level controlled Vital Signs Assessment: post-procedure vital signs reviewed and stable Respiratory status: spontaneous breathing Cardiovascular status: stable Postop Assessment: no headache, epidural receding, patient able to bend at knees and no signs of nausea or vomiting Anesthetic complications: no Comments: Pt. States she is walking.  Reports pain score of 8 and states bedside RN is coming with pain meds.     Last Vitals:  Vitals:   04/25/19 1100 04/25/19 1450  BP: 119/60 108/65  Pulse: 64 (!) 58  Resp: 18 18  Temp: 36.5 C 36.9 C  SpO2:      Last Pain:  Vitals:   04/25/19 1450  TempSrc: Oral  PainSc: 0-No pain   Pain Goal: Patients Stated Pain Goal: 1 (04/24/19 0949)                 Merrilyn Puma

## 2019-04-25 NOTE — Discharge Summary (Signed)
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 yo G2P1001 at 464w1das admitted to the hospital 04/24/2019 for induction of labor.  Indication for induction: Postdates.  Patient had an uncomplicated labor course as follows:  Her induction was start with FB. She was spontaneously contracting, so Pitocin was added. She progressed and then was AROMed. She continued to make cervical change, progressed to complete, and delivered shortly after.  Membrane Rupture Time/Date: 5:53 PM ,04/24/2019   Intrapartum Procedures: Episiotomy: None [1]                                         Lacerations:  Periurethral [8];Vaginal [6]  Patient had delivery of a Viable infant.  Information for the patient's newborn:  KeEnez, Monahan0[[id number]]Delivery Method: Vaginal, Spontaneous(Filed from Delivery Summary)    04/24/2019  Details of delivery can be found in separate delivery note.  Patient had a routine postpartum course. Patient is discharged home 04/26/19. Delivery time: 11:54 PM    Magnesium Sulfate received: No BMZ received:  No Rhophylac:N/A MMR:N/A Transfusion:No  Physical exam  Vitals:   04/25/19 1100 04/25/19 1450 04/25/19 2200 04/26/19 0555  BP: 119/60 108/65 110/60 (!) 96/54  Pulse: 64 (!) 58 (!) 55 (!) 57  Resp: '18 18 20 16  ' Temp: 97.7 F (36.5 C) 98.4 F (36.9 C)  98.3 F (36.8 C)  TempSrc: Oral Oral  Oral  SpO2:   100% 100%  Weight:      Height:       General: alert, cooperative and no distress Lochia: appropriate Uterine Fundus: firm Incision: N/A DVT Evaluation: No evidence of DVT seen on physical exam. Negative Homan's sign. No cords or calf tenderness. Labs: Lab Results  Component Value Date   WBC 15.0 (H) 04/25/2019   HGB 9.3 (L) 04/25/2019   HCT 28.8 (L) 04/25/2019   MCV 90.6 04/25/2019   PLT 158 04/25/2019   CMP Latest Ref Rng & Units 10/07/2018  Glucose 65 - 99 mg/dL 80  BUN 6 - 20 mg/dL 9  Creatinine 0.57 - 1.00 mg/dL 0.63  Sodium 134 -  144 mmol/L 136  Potassium 3.5 - 5.2 mmol/L 4.3  Chloride 96 - 106 mmol/L 100  CO2 20 - 29 mmol/L 20  Calcium 8.7 - 10.2 mg/dL 9.7  Total Protein 6.0 - 8.5 g/dL 6.9  Total Bilirubin 0.0 - 1.2 mg/dL <0.2  Alkaline Phos 39 - 117 IU/L 65  AST 0 - 40 IU/L 21  ALT 0 - 32 IU/L 17    Discharge instruction: per After Visit Summary and "Baby and Me Booklet".  After visit meds:  Allergies as of 04/26/2019   No Known Allergies     Medication List    TAKE these medications   acetaminophen 325 MG tablet Commonly known as: Tylenol Take 2 tablets (650 mg total) by mouth every 4 (four) hours as needed (for pain scale < 4).   Blood Pressure Kit Devi 1 Device by Does not apply route daily. ICD 10: Z34.00   cephALEXin 500 MG capsule Commonly known as: KEFLEX Take 1 capsule (500 mg total) by mouth 4 (four) times daily.   ferrous sulfate 325 (65 FE) MG tablet Take 1 tablet (325 mg total) by mouth daily with breakfast.   ibuprofen 600 MG tablet Commonly known as: ADVIL Take 1 tablet (600 mg total) by mouth every 6 (six) hours.    Prenatal Adult Gummy/DHA/FA 0.4-25 MG Chew Chew 2 tablets by mouth daily.   PRENATAL VITAMINS PO Take 1 tablet by mouth daily.   PrePLUS 27-1 MG Tabs Take 1 tablet by mouth daily.       Diet: routine diet  Activity: Advance as tolerated. Pelvic rest for 6 weeks.   Outpatient follow up:4 weeks Follow up Appt:No future appointments. Follow up Visit: Please schedule this patient for Postpartum visit in: 4 weeks with the following provider: Any provider For C/S patients schedule nurse incision check in weeks 2 weeks: no Low risk pregnancy complicated by: post dates Delivery mode:  SVD Anticipated Birth Control:  Depo PP Procedures needed: None  Schedule Integrated BH visit: no Will need TOC for Trich and UC in 6 weeks- message sent to office  Newborn Data: Live born female  Birth Weight:  3545 g APGAR: 61, 9  Newborn Delivery   Birth date/time: 04/24/2019 23:54:00 Delivery type: Vaginal, Spontaneous      Baby Feeding: Bottle Disposition:home with mother   04/26/2019 Merilyn Baba, DO

## 2019-04-26 MED ORDER — FERROUS SULFATE 325 (65 FE) MG PO TABS: 325.0000 mg | ORAL_TABLET | Freq: Every day | ORAL | 3 refills | Status: DC

## 2019-04-26 MED ORDER — ACETAMINOPHEN 325 MG PO TABS: 650.0000 mg | ORAL_TABLET | ORAL | 0 refills | Status: DC | PRN

## 2019-04-26 MED ORDER — IBUPROFEN 600 MG PO TABS: 600.0000 mg | ORAL_TABLET | Freq: Four times a day (QID) | ORAL | 0 refills | Status: DC

## 2019-04-26 NOTE — Progress Notes (Signed)
CSW received consult for hx of marijuana use.  Referral was screened out due to the following: ~MOB had no documented substance use after initial prenatal visit/+UPT. ~MOB had no positive drug screens after initial prenatal visit/+UPT. ~Baby's UDS is negative.  Please consult CSW if current concerns arise or by MOB's request.     Shaneika Rossa S. Iliany Losier, MSW, LCSW [facility] [phone]   

## 2019-04-27 LAB — TYPE AND SCREEN
ABO/RH(D): B POS
Antibody Screen: POSITIVE
Unit division: 0
Unit division: 0

## 2019-04-27 LAB — BPAM RBC
Blood Product Expiration Date: 202103092359
Blood Product Expiration Date: 202103102359
Unit Type and Rh: 7300
Unit Type and Rh: 7300

## 2019-05-13 ENCOUNTER — Telehealth: Payer: Self-pay | Admitting: *Deleted

## 2019-05-13 NOTE — Telephone Encounter (Addendum)
VM message received by [facility] Manager w/[facility]. She stated that she is calling to make the doctor aware that pt is not taking the following medications: Prenatal vitamins, FeSo4. She can be reached @ [phone] if needed. I attempted to contact pt and heard message stating that the call cannot be completed @ this time. Pt should be counseled that she is anemic and needs FeSo4 as prescribed as well as PNV are considered to be valuable for her health during the postpartum period.   2/26  1207  MyChart message sent to pt regarding need to take PNV and iron tablets.

## 2019-05-14 ENCOUNTER — Encounter: Payer: Self-pay | Admitting: *Deleted

## 2019-05-14 NOTE — Telephone Encounter (Signed)
Attempted to call patient. Left message for patient to call the office at [phone].

## 2019-06-02 ENCOUNTER — Encounter (HOSPITAL_COMMUNITY): Payer: Self-pay | Admitting: Emergency Medicine

## 2019-06-02 ENCOUNTER — Other Ambulatory Visit: Payer: Self-pay

## 2019-06-02 ENCOUNTER — Emergency Department (HOSPITAL_COMMUNITY)
Admission: EM | Admit: 2019-06-02 | Discharge: 2019-06-03 | Disposition: A | Discharge status: Other Acute Inpatient Hospital | Payer: Medicaid Other | Attending: Emergency Medicine | Admitting: Emergency Medicine

## 2019-06-02 DIAGNOSIS — O99893 Other specified diseases and conditions complicating puerperium: Secondary | ICD-10-CM | POA: Diagnosis present

## 2019-06-02 DIAGNOSIS — R45851 Suicidal ideations: Secondary | ICD-10-CM | POA: Diagnosis not present

## 2019-06-02 DIAGNOSIS — F17210 Nicotine dependence, cigarettes, uncomplicated: Secondary | ICD-10-CM | POA: Diagnosis not present

## 2019-06-02 DIAGNOSIS — R451 Restlessness and agitation: Secondary | ICD-10-CM | POA: Insufficient documentation

## 2019-06-02 DIAGNOSIS — F329 Major depressive disorder, single episode, unspecified: Secondary | ICD-10-CM | POA: Insufficient documentation

## 2019-06-02 DIAGNOSIS — Z046 Encounter for general psychiatric examination, requested by authority: Secondary | ICD-10-CM

## 2019-06-02 DIAGNOSIS — F1010 Alcohol abuse, uncomplicated: Secondary | ICD-10-CM | POA: Diagnosis not present

## 2019-06-02 DIAGNOSIS — Z20822 Contact with and (suspected) exposure to covid-19: Secondary | ICD-10-CM | POA: Insufficient documentation

## 2019-06-02 MED ORDER — LORAZEPAM 2 MG/ML IJ SOLN
2.0000 mg | Freq: Once | INTRAMUSCULAR | Status: AC
  Administered 2019-06-02: 2 mg via INTRAMUSCULAR
  Filled 2019-06-02: qty 1
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
## 2019-06-02 NOTE — ED Provider Notes (Signed)
[facility] Provider Note   CSN: [id number] Arrival date & time: 06/02/19  2254     History Chief Complaint  Patient presents with  . Suicidal    IVC    Kristin Brock is a 24 y.o. female.  The history is provided by the patient and medical records.    24 year old female with history of asthma, presenting to the ED under IVC.  Patient was reportedly at home and drinking with friends when she apparently wanted to go out and her "baby daddy" had a problem with this.  There was a verbal altercation, police were called.  Upon their arrival patient proceeded to run to the house, grabbed a knife and held it to her throat stating she wanted to kill herself.  Patient does not have documented psychiatric history, but apparently has had issues with alcohol in the past.  Past Medical History:  Diagnosis Date  . Asthma     Patient Active Problem List   Diagnosis Date Noted  . Post-dates pregnancy 04/24/2019  . UTI (urinary tract infection) in pregnancy in first trimester 10/11/2018  . Trichomonal vaginitis during pregnancy in first trimester 10/08/2018  . 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 Supervision of other normal pregnancy, antepartum 08/25/2018  . Subchorionic hemorrhage in first trimester 08/25/2018    Past Surgical History:  Procedure Laterality Date  . NO PAST SURGERIES       OB History    Gravida  2   Para  2   Term  2   Preterm  0   AB  0   Living  2     SAB      TAB      Ectopic      Multiple  0   Live Births  2           History reviewed. No pertinent family history.  Social History   Tobacco Use  . Smoking status: Current Every Day Smoker    Packs/day: 0.25    Types: Cigarettes  . Smokeless tobacco: Never Used  Substance Use Topics  . Alcohol use: Yes    Alcohol/week: 3.0 standard drinks    Types: 3 Glasses of wine per week    Comment: daily  . Drug use: Not  Currently    Types: Marijuana    Comment: last used 2019    Home Medications Prior to Admission medications   Medication Sig Start Date End Date Taking? Authorizing Provider  acetaminophen (TYLENOL) 325 MG tablet Take 2 tablets (650 mg total) by mouth every 4 (four) hours as needed (for pain scale < 4). 04/26/19   Sparacino, Hailey L, DO  Blood Pressure Monitoring (BLOOD PRESSURE KIT) DEVI 1 Device by Does not apply route daily. ICD 10: Z34.00 01/06/19   Luvenia Redden, PA-C  calcium carbonate (TUMS - DOSED IN MG ELEMENTAL CALCIUM) 500 MG chewable tablet Chew 2 tablets by mouth as needed for indigestion or heartburn.    [provider]  cephALEXin (KEFLEX) 500 MG capsule Take 1 capsule (500 mg total) by mouth 4 (four) times daily. Patient not taking: Reported on 04/22/2019 04/18/19   Luvenia Redden, PA-C  ferrous sulfate 325 (65 FE) MG tablet Take 1 tablet (325 mg total) by mouth daily with breakfast. 04/26/19   Sparacino, Hailey L, DO  ibuprofen (ADVIL) 600 MG tablet Take 1 tablet (600 mg total) by mouth every 6 (six) hours. 04/26/19   Mathis Dad  L, DO  Prenatal MV & Min w/FA-DHA (PRENATAL ADULT GUMMY/DHA/FA) 0.4-25 MG CHEW Chew 2 tablets by mouth daily. Patient not taking: Reported on 03/03/2019 01/06/19   Wenzel, Julie N, PA-C  Prenatal Vit-Fe Fumarate-FA (PREPLUS) 27-1 MG TABS Take 1 tablet by mouth daily. Patient not taking: Reported on 02/03/2019 01/06/19   Wenzel, Julie N, PA-C    Allergies    Patient has no known allergies.  Review of Systems   Review of Systems  Psychiatric/Behavioral: Positive for suicidal ideas.  All other systems reviewed and are negative.   Physical Exam Updated Vital Signs BP (!) 139/106 (BP Location: Right Arm)   Pulse 68   Resp (!) 22   LMP 07/06/2018 (Approximate)   SpO2 98%   Physical Exam Vitals and nursing note reviewed.  Constitutional:      Appearance: She is well-developed.     Comments: Handcuffed, yelling and cursing,  intoxicated  HENT:     Head: Normocephalic and atraumatic.  Eyes:     Conjunctiva/sclera: Conjunctivae normal.     Pupils: Pupils are equal, round, and reactive to light.  Cardiovascular:     Rate and Rhythm: Normal rate and regular rhythm.     Heart sounds: Normal heart sounds.  Pulmonary:     Effort: Pulmonary effort is normal.     Breath sounds: Normal breath sounds.  Abdominal:     General: Bowel sounds are normal.     Palpations: Abdomen is soft.  Musculoskeletal:        General: Normal range of motion.     Cervical back: Normal range of motion.  Skin:    General: Skin is warm and dry.  Neurological:     Mental Status: She is alert and oriented to person, place, and time.  Psychiatric:     Comments: Agitated, belligerent, continues yelling and screaming at staff     ED Results / Procedures / Treatments   Labs (all labs ordered are listed, but only abnormal results are displayed) Labs Reviewed  COMPREHENSIVE METABOLIC PANEL - Abnormal; Notable for the following components:      Result Value   CO2 21 (*)    Glucose, Bld 110 (*)    BUN 5 (*)    All other components within normal limits  ETHANOL - Abnormal; Notable for the following components:   Alcohol, Ethyl (B) 211 (*)    All other components within normal limits  SALICYLATE LEVEL - Abnormal; Notable for the following components:   Salicylate Lvl <7.0 (*)    All other components within normal limits  ACETAMINOPHEN LEVEL - Abnormal; Notable for the following components:   Acetaminophen (Tylenol), Serum <10 (*)    All other components within normal limits  CBC  RAPID URINE DRUG SCREEN, HOSP PERFORMED  I-STAT BETA HCG BLOOD, ED ([facility], [facility], [facility] ONLY)    EKG None  Radiology No results found.  Procedures Procedures (including critical care time)  CRITICAL CARE Performed by:  M    Total critical care time: 45 minutes  Critical care time was exclusive of separately billable procedures and treating  other patients.  Critical care was necessary to treat or prevent imminent or life-threatening deterioration.  Critical care was time spent personally by me on the following activities: development of treatment plan with patient and/or surrogate as well as nursing, discussions with consultants, evaluation of patient's response to treatment, examination of patient, obtaining history from patient or surrogate, ordering and performing treatments and interventions, ordering and review of   laboratory studies, ordering and review of radiographic studies, pulse oximetry and re-evaluation of patient's condition.   Medications Ordered in ED Medications  LORazepam (ATIVAN) injection 2 mg (2 mg Intramuscular Given 06/02/19 2349)  ziprasidone (GEODON) injection 20 mg (20 mg Intramuscular Given 06/03/19 0120)    ED Course  I have reviewed the triage vital signs and the nursing notes.  Pertinent labs & imaging results that were available during my care of the patient were reviewed by me and considered in my medical decision making (see chart for details).    MDM Rules/Calculators/A&P  24 y.o. F here with SI.   Per [employer], she has been drinking with friends, wanted to go out which caused an argument between patient and 'baby daddy", in which patient held a knife to her throat and stated she wanted to kill herself.  IVC papers taken out by [employer].  Here in the ED, she is intoxicated to the point of being belligerent, yelling and cursing at [facility] along with ED staff.  Patient making statements of "it's gonna be a fight" when trying to draw her blood.  She was given 68m Ativan IM.  Blood work was able to be obtained.  1:14 AM Notified by RN that patient is now handcuffed to bed but continues banging her head on the bedside rails, fighting with police who are attempting to restrain here. At this point, she is a danger to herself and others.  Will give IM Geodon for patient and staff safety.  Blood work is reassuring,  ethanol 211.  UDS pending.  Patient medically cleared.  Plan for TTS consult once awake/alert.  TTS unable to be completed overnight as patient still sleepy from Geodon.  Care will be signed to morning team to follow-up on TTS recommendations.  Final Clinical Impression(s) / ED Diagnoses Final diagnoses:  Involuntary commitment    Rx / DC Orders ED Discharge Orders    None       SLarene Pickett PA-C 06/03/19 0559    MMerrily Pew MD 06/03/19 [phone]

## 2019-06-03 LAB — CBC
HCT: 37.9 % (ref 36.0–46.0)
Hemoglobin: 12.4 g/dL (ref 12.0–15.0)
MCH: 29.7 pg (ref 26.0–34.0)
MCHC: 32.7 g/dL (ref 30.0–36.0)
MCV: 90.9 fL (ref 80.0–100.0)
Platelets: 296 10*3/uL (ref 150–400)
RBC: 4.17 MIL/uL (ref 3.87–5.11)
RDW: 15.5 % (ref 11.5–15.5)
WBC: 8 10*3/uL (ref 4.0–10.5)
nRBC: 0 % (ref 0.0–0.2)

## 2019-06-03 LAB — ETHANOL: Alcohol, Ethyl (B): 211 mg/dL — ABNORMAL HIGH (ref <10)

## 2019-06-03 LAB — COMPREHENSIVE METABOLIC PANEL
ALT: 22 U/L (ref 0–44)
AST: 30 U/L (ref 15–41)
Albumin: 3.7 g/dL (ref 3.5–5.0)
Alkaline Phosphatase: 85 U/L (ref 38–126)
Anion gap: 12 (ref 5–15)
BUN: 5 mg/dL — ABNORMAL LOW (ref 6–20)
CO2: 21 mmol/L — ABNORMAL LOW (ref 22–32)
Calcium: 9 mg/dL (ref 8.9–10.3)
Chloride: 109 mmol/L (ref 98–111)
Creatinine, Ser: 0.81 mg/dL (ref 0.44–1.00)
GFR calc Af Amer: >60 mL/min (ref >60)
GFR calc non Af Amer: >60 mL/min (ref >60)
Glucose, Bld: 110 mg/dL — ABNORMAL HIGH (ref 70–99)
Potassium: 3.6 mmol/L (ref 3.5–5.1)
Sodium: 142 mmol/L (ref 135–145)
Total Bilirubin: 0.3 mg/dL (ref 0.3–1.2)
Total Protein: 7.6 g/dL (ref 6.5–8.1)

## 2019-06-03 LAB — SALICYLATE LEVEL: Salicylate Lvl: <7 mg/dL — ABNORMAL LOW (ref 7.0–30.0)

## 2019-06-03 LAB — I-STAT BETA HCG BLOOD, ED (MC, WL, AP ONLY): I-stat hCG, quantitative: <5 m[IU]/mL (ref <5)

## 2019-06-03 LAB — RESPIRATORY PANEL BY RT PCR (FLU A&B, COVID)
Influenza A by PCR: NEGATIVE
Influenza B by PCR: NEGATIVE
SARS Coronavirus 2 by RT PCR: NEGATIVE

## 2019-06-03 LAB — ACETAMINOPHEN LEVEL: Acetaminophen (Tylenol), Serum: <10 ug/mL — ABNORMAL LOW (ref 10–30)

## 2019-06-03 MED ORDER — ZIPRASIDONE MESYLATE 20 MG IM SOLR
20.0000 mg | Freq: Once | INTRAMUSCULAR | Status: AC
  Administered 2019-06-03: 20 mg via INTRAMUSCULAR
  Filled 2019-06-03: qty 20

## 2019-06-03 NOTE — ED Notes (Signed)
Pt voluntarily removed jewelry. Jewelry removed: 2 gold necklaces, 2 gold rings, 2 silver rings, and 1 silver heart bracelet. Pt unavailable for signature. [employer] at bedside.   Valuables envelope [id number] Security bag [phone]

## 2019-06-03 NOTE — [facility] Counselor (Signed)
Per nurse pt unable to complete assessment at this time given Geodon. TTS will attempt assessment again once pt is roused.

## 2019-06-03 NOTE — ED Notes (Signed)
Spoke with pt's sister and updated her and significant other on pending transfer to [facility] after covid test results.

## 2019-06-03 NOTE — ED Notes (Signed)
Pt irritated, agitated, and hostile d/t hospitalization. Pt. Expressing loudly, rapid,  repetitive, and uncooperative with RN questions. Pt uncooperative. 1 mg IM ativan given.

## 2019-06-03 NOTE — ED Notes (Signed)
Pt currently speaking to significant other on phone.

## 2019-06-03 NOTE — ED Notes (Signed)
TTS in process 

## 2019-06-03 NOTE — ED Notes (Signed)
Pt belonging inventoried includes:  1 pair of shoes 1 pair of blue socks 1 pair of black leggings 1 black t shirt  1 black bra.   Pt belongings takes to locker 3

## 2019-06-03 NOTE — ED Notes (Signed)
Pt resting comfortably. Respirations equal and unlabored.

## 2019-06-03 NOTE — ED Notes (Signed)
Attempted to call report to [facility]. Was placed on hold for over 10 minutes both times. No one is currently answering the number provided for their facility. Will attempt report again shortly.

## 2019-06-03 NOTE — ED Notes (Signed)
Called report to [place] at [facility]

## 2019-06-03 NOTE — Progress Notes (Signed)
Pt accepted to [facility], main campus    Dr. Loyola Mast is the accepting provider   Call report to [phone]  Pt is voluntary and will be transported by [employer]  Pt may arrive to [facility] after testing negative for Covid.   Wells Guiles, LCSW, LCAS Disposition CSW [facility]/TTS [phone]

## 2019-06-03 NOTE — Progress Notes (Addendum)
CSW spoke with [facility] of TTS regarding this patient. Jessie Foot reports she completed the patient's TTS evaluation and it was revealed that the patient has two children, ages 44 and 5 weeks.  CSW contacted FOB Marlon Reynolds at [phone] to determine the whereabouts of the children. Marlon reports he has both of the children at home with him and that they are safe and well cared for. Marlon reports having all items at home needed for child care. Marlon reports the patient has the EBT and [facility] card. Marlon reports he has only one can of formula remaining for the infant.  CSW will attempt to facilitate exchange of benefit cards from patient to the FOB. CSW spoke with Selena Batten, RN who reports patient is sleeping at this time - CSW will attempt to speak with again shortly.  Edwin Dada, MSW, LCSW-A Transitions of Care  Clinical Social Worker  [facility] Emergency Departments  Medical ICU [phone]

## 2019-06-03 NOTE — ED Notes (Signed)
Breakfast Ordered 

## 2019-06-03 NOTE — ED Notes (Signed)
Pt handcuffed continues to fight [employer]. Currently banging head on side rails. [employer] holding pt down. PA Misty Stanley informed to place orders

## 2019-06-03 NOTE — BH Assessment (Signed)
Clinician contacted pt's nurse, Candy, RN, in an effort to complete pt's BH Assessment. Pt's nurse states pt is still under the influence of the medication she was given earlier this evening and cannot be aroused at this time. TTS will attempt at a later time.

## 2019-06-03 NOTE — ED Notes (Signed)
Pt agitated. While undressing pt preceded to show her breast and dance "twerk" to the officers.   Pt now in purple scrubs. Belongings removed. Pt informed of IVC process. Pt continues to attempt escape from room. Pt handcuffed.

## 2019-06-03 NOTE — ED Notes (Signed)
SW called to speak to patient. Pt sleeping soundly. SW will call back at a later time.

## 2019-06-03 NOTE — BH Assessment (Addendum)
Assessment Note  Kristin Brock is an 24 y.o. female. She presented to [facility] by [employer]. Patient is under IVC. She was brought to [facility] ED by police after threatening to harm herself. She reportedly held a knife to throat after arguing with the father of her two children. She gave birth to a child on 04/24/2019. She also has a 60 yr old in the home. Patient was noted to be hanging out with friends and drinking when the incident occurred. Upon assessing patient she initially stated that she wasn't sure what brought her to the Emergency Department. She then responded, "I'm here because my baby daddy play to much". When asked to elaborate she refused. When asked to provide an example she refused. She does admit to holding a knife to her throat but states, "I don't know why I did that". She was asked if this was an attempt to inflict harm on herself or commit suicide. She responded, "I don't know". Patient responded, "I don't know" when asked about past thoughts to kill herself. She denies history of self mutilating behaviors. She does admit to depression but denies triggers. She describes her depressive symptoms as isolating self from others, crying spells, anger/irritability, loss of interest in usual pleasures, hopelessness, worthlessness, etc. Patient denied HI. Denies AVH's. She did not appear to be responding to internal stimuli. However, was invasive with answering questions, laid down on the bed, and pulled the sheets over her head. She denies history of inpatient treatment. She denies having an outpatient provider currently or in the past to treatment mental illnesses. She denies a family history of mental illness. She reports daily alcohol use since giving birth to her child in February. She drinks #4 Nydia Bouton Lemonades daily. However, her history indicates use of alcohol throughout her pregnancy so this information may not bed accurate. Also, her medical history of alcohol usage during her pregnancy. She denies that  CPS is involved. She states that her children are currently with her "baby daddy". She feels like they are safe. States, "I am not staying here", "I leaving", "I need to take care of my kids".  Collateral Information from the father of the children Stephannie Li) [phone]: He states that both children are safe and with him. He explains that patient has no psychiatric history. He has known her for several yrs. He has noticed a major change in her mental health status stating 2-3 weeks ago. He explains that they are both from [place] [facility]) and moved to [place] 1 yr ago this month. States that neither of them know anyone here. However, Nolita has made new friends and they may be negatively influencing her. He is aware that she has a drinking problem and also uses THC regularly. He asked her to slow down on her drinking however feels her usage has increased since giving birth. He states that she was also using substance while pregnant.  States that he also suspects that she is using other drugs but not sure what kind. He does feel like she is safe around the kids because she held a knife to her throat last night. He states, "I am in shock because I've never seen her like this before". He doesn't feel she is safe in the home with the children and would like to send her back to [place] with family. He states, "I don't want to loose my kids because of her acting life this". He is willing to take care of children if patient is admitted to the  hospital stating, "Please keep her because she really needs help". He has never known patient to have a mental health history or report suicidal ideations in the past. He denies her verbalizing intent to harm others or experiencing AVH's.   This counselor contacted the [facility] Social Worker Okey Regal) and patient's nurse to express concern for the wellbeing of the children in the home. The [facility] Social worker will follow up accordingly.   Diagnosis: Major  Depressive Disorder and Alcohol Use Disorder   Past Medical History:  Past Medical History:  Diagnosis Date  . Asthma     Past Surgical History:  Procedure Laterality Date  . NO PAST SURGERIES      Family History: History reviewed. No pertinent family history.  Social History:  reports that she has been smoking cigarettes. She has been smoking about 0.25 packs per day. She has never used smokeless tobacco. She reports current alcohol use of about 3.0 standard drinks of alcohol per week. She reports previous drug use. Drug: Marijuana.  Additional Social History:  Alcohol / Drug Use Pain Medications: SEE MAR Prescriptions: SEE MAR Over the Counter: SEE MAR History of alcohol / drug use?: Yes Substance #1 Name of Substance 1: Alcohol 1 - Age of First Use: 24 yrs old 1 - Amount (size/oz): "I drink about 4 Mike's hard lemonades" 1 - Frequency: 4x's a day patient states she drinks Mikes hard lemonade 1 - Duration: "After I gave birth to my son  a few weeks ago I've been drinking" 1 - Last Use / Amount: "I don't know how much I drank but it was to much"  CIWA: CIWA-Ar BP: (!) 139/106 Pulse Rate: 68 COWS:    Allergies: No Known Allergies  Home Medications: (Not in a hospital admission)   OB/GYN Status:  Patient's last menstrual period was 07/06/2018 (approximate).  General Assessment Data Assessment unable to be completed: Yes Reason for not completing assessment: Pt is still under the influence of the medication she was given earlier this evening and cannot be aroused at this time. Location of Assessment: [facility] ED TTS Assessment: In system Is this a Tele or Face-to-Face Assessment?: Tele Assessment Is this an Initial Assessment or a Re-assessment for this encounter?: Initial Assessment Patient Accompanied by:: (brought by [employer]) Language Other than English: No Living Arrangements: ("I live with my baby father"; 2 children (son and daughter)) What gender do you identify as?:  Female Marital status: Single Maiden name: (n/a) Pregnancy Status: No Living Arrangements: Spouse/significant other, Children Can pt return to current living arrangement?: Yes Admission Status: Voluntary Is patient capable of signing voluntary admission?: Yes Referral Source: Self/Family/Friend Insurance type: (Medicaid )     Crisis Care Plan Living Arrangements: Spouse/significant other, Children Legal Guardian: (no legal guardian ) Name of Psychiatrist: (no psychiatrist ) Name of Therapist: (no therapist )  Education Status Is patient currently in school?: No Is the patient employed, unemployed or receiving disability?: (pt sts, "I don't know" when asked about how she supports sel)  Risk to self with the past 6 months Suicidal Ideation: Yes-Currently Present(Patient stating she doesn't know if she is suicidal or not) Has patient been a risk to self within the past 6 months prior to admission? : Yes Suicidal Intent: Yes-Currently Present Has patient had any suicidal intent within the past 6 months prior to admission? : Yes Is patient at risk for suicide?: Yes Suicidal Plan?: No-Not Currently/Within Last 6 Months Has patient had any suicidal plan within the past 6  months prior to admission? : Yes Specify Current Suicidal Plan: (patient put a knife to throat) Access to Means: Yes Specify Access to Suicidal Means: (knife) What has been your use of drugs/alcohol within the last 12 months?: (thc and alcohol ) Previous Attempts/Gestures: No How many times?: (0) Other Self Harm Risks: (no self harm ) Triggers for Past Attempts: Other (Comment)(no prior attempts to harm self ) Intentional Self Injurious Behavior: None Family Suicide History: (Yes; multiple family members dx's w/ Bipolar/Schizophrenia) Recent stressful life event(s): Other (Comment) Persecutory voices/beliefs?: No Depression: Yes Depression Symptoms: Feeling angry/irritable, Feeling worthless/self pity, Loss of  interest in usual pleasures, Guilt, Fatigue, Isolating, Tearfulness, Insomnia, Despondent Substance abuse history and/or treatment for substance abuse?: Yes Suicide prevention information given to non-admitted patients: Not applicable  Risk to Others within the past 6 months Homicidal Ideation: No Does patient have any lifetime risk of violence toward others beyond the six months prior to admission? : No Thoughts of Harm to Others: No Current Homicidal Intent: No Current Homicidal Plan: No Access to Homicidal Means: Yes Describe Access to Homicidal Means: (access to a knife) Identified Victim: (no victim) History of harm to others?: No Assessment of Violence: None Noted Violent Behavior Description: (currently calm and cooperative ) Does patient have access to weapons?: Yes (Comment) Criminal Charges Pending?: No Does patient have a court date: No Is patient on probation?: No  Psychosis Hallucinations: None noted Delusions: None noted  Mental Status Report Appearance/Hygiene: Disheveled Eye Contact: Poor Motor Activity: Agitation Speech: Logical/coherent Level of Consciousness: Alert Mood: Depressed, Irritable Affect: Depressed, Irritable Anxiety Level: Severe Panic attack frequency: (patient reports frequent panic attacks when pregnant) Most recent panic attack: ("When I was pregnant....a month ago") Thought Processes: Circumstantial, Relevant Judgement: Impaired Orientation: Person, Place, Time, Situation Obsessive Compulsive Thoughts/Behaviors: None  Cognitive Functioning Concentration: Decreased Memory: Recent Intact, Remote Intact Is patient IDD: No Insight: Poor Impulse Control: Poor Appetite: Poor Have you had any weight changes? : ("I'm not sure") Sleep: ("I don't know") Total Hours of Sleep: (3-4 hrs per night) Vegetative Symptoms: None  ADLScreening [facility] Assessment Services) Patient's cognitive ability adequate to safely complete daily activities?:  Yes Patient able to express need for assistance with ADLs?: Yes Independently performs ADLs?: Yes (appropriate for developmental age)  Prior Inpatient Therapy Prior Inpatient Therapy: No  Prior Outpatient Therapy Prior Outpatient Therapy: No Does patient have an ACCT team?: No Does patient have Intensive In-House Services?  : No Does patient have [facility] services? : No Does patient have P4CC services?: No  ADL Screening (condition at time of admission) Patient's cognitive ability adequate to safely complete daily activities?: Yes Is the patient deaf or have difficulty hearing?: No Does the patient have difficulty seeing, even when wearing glasses/contacts?: No Does the patient have difficulty concentrating, remembering, or making decisions?: No Patient able to express need for assistance with ADLs?: Yes Does the patient have difficulty dressing or bathing?: No Independently performs ADLs?: Yes (appropriate for developmental age) Does the patient have difficulty walking or climbing stairs?: No Weakness of Legs: None Weakness of Arms/Hands: None  Home Assistive Devices/Equipment Home Assistive Devices/Equipment: None  Therapy Consults (therapy consults require a physician order) PT Evaluation Needed: No OT Evalulation Needed: No SLP Evaluation Needed: No Abuse/Neglect Assessment (Assessment to be complete while patient is alone) Physical Abuse: Denies Verbal Abuse: Denies Sexual Abuse: Denies Self-Neglect: Denies Values / Beliefs Cultural Requests During Hospitalization: None Spiritual Requests During Hospitalization: None Consults Spiritual Care Consult Needed: No Transition of Care  Team Consult Needed: No Advance Directives (For Healthcare) Does Patient Have a Medical Advance Directive?: No Would patient like information on creating a medical advance directive?: No - Patient declined Nutrition Screen- [facility] Adult/WL/AP Patient's home diet: Regular Has the patient  recently lost weight without trying?: No Has the patient been eating poorly because of a decreased appetite?: No Malnutrition Screening Tool Score: 0        Disposition: Disposition Initial Assessment Completed for this Encounter: Yes Disposition of Patient: Admit(Per [place], pt meets criteria for inpatient criteria) Type of inpatient treatment program: Adult(Admit Adult )     Per Tinnie Gens, NP, patient meets criteria for inpatient treatment. AC aware of patient's need for a bed. Patient to be place at [facility] and/or will need to be referred to a hospital with an appropriate bed.   On Site Evaluation by:   Reviewed with Physician:    Waldon Merl 06/03/2019 11:05 AM

## 2019-06-03 NOTE — ED Notes (Signed)
Mother would like an update [phone]

## 2019-06-03 NOTE — Progress Notes (Signed)
Pt meets inpatient criteria per Denzil Magnuson, NP. Referral information has been sent to the following hospitals for review:  [facility] Details  [facility] Details [facility] Details [facility] Details [facility] Details [facility] Details  [facility] Details  [facility] Details  [facility] Details  [facility] Details [facility]  Disposition will continue to follow for inpatient placement needs.   Wells Guiles, LCSW, LCAS Disposition CSW [facility]/TTS [phone]

## 2019-06-08 ENCOUNTER — Ambulatory Visit: Payer: Medicaid Other | Admitting: Advanced Practice Midwife

## 2019-08-27 ENCOUNTER — Other Ambulatory Visit: Payer: Self-pay | Admitting: Medical

## 2019-08-27 ENCOUNTER — Other Ambulatory Visit: Payer: Self-pay

## 2019-08-27 DIAGNOSIS — O2341 Unspecified infection of urinary tract in pregnancy, first trimester: Secondary | ICD-10-CM

## 2019-09-02 MED ORDER — ACETAMINOPHEN 325 MG PO TABS: 650.0000 mg | ORAL_TABLET | ORAL | 0 refills | Status: DC | PRN

## 2019-09-21 ENCOUNTER — Ambulatory Visit: Payer: Medicaid Other | Admitting: Advanced Practice Midwife

## 2019-11-01 ENCOUNTER — Other Ambulatory Visit: Payer: Self-pay

## 2019-11-01 ENCOUNTER — Ambulatory Visit (INDEPENDENT_AMBULATORY_CARE_PROVIDER_SITE_OTHER): Payer: Medicaid Other | Admitting: *Deleted

## 2019-11-01 DIAGNOSIS — Z3200 Encounter for pregnancy test, result unknown: Secondary | ICD-10-CM | POA: Diagnosis not present

## 2019-11-01 LAB — POCT PREGNANCY, URINE: Preg Test, Ur: POSITIVE — AB

## 2019-11-01 NOTE — Progress Notes (Signed)
Patient seen and assessed by nursing staff.  Agree with documentation and plan.  

## 2019-11-01 NOTE — Progress Notes (Signed)
Pt submitted urine specimen for pregnancy test. I called pt and informed her of +UPT. She reports LMP - 08/26/19 which yields EDD 06/01/20, now [redacted]w[redacted]d. Pt endorses some mild abdominal cramping however denies having vaginal bleeding. I advised her to go to [facility] if she develops heavy vaginal bleeding or severe abdominal pain. Pt states she is planning termination of pregnancy and would like to receive appointment for birth control in this office following the procedure. I advised pt to contact our office for the appointment once the termination has been completed. She agreed and voiced understanding of all information and instructions received.

## 2019-11-12 ENCOUNTER — Telehealth: Payer: Self-pay | Admitting: Family Medicine

## 2019-11-12 NOTE — Telephone Encounter (Signed)
Patient calling requesting a RX for [employer]

## 2019-11-15 NOTE — Telephone Encounter (Signed)
Called pt. Pt states she has not used birth control in the past few years so she cannot recall type of oral contraceptive. I explained pt will need to come for a visit with a provider to discuss birth control options. Advised pt to not have unprotected intercourse for 2 weeks prior to appt so that birth control can be started the day of appt. Pt verbalizes understanding and is expecting a call from front office staff.  Per chart review pt was not seen for postpartum appt following most recent pregnancy. PAP smear up to date.

## 2019-12-21 ENCOUNTER — Ambulatory Visit: Payer: Medicaid Other | Admitting: Advanced Practice Midwife

## 2020-01-10 ENCOUNTER — Ambulatory Visit (INDEPENDENT_AMBULATORY_CARE_PROVIDER_SITE_OTHER): Payer: Medicaid Other

## 2020-01-10 ENCOUNTER — Other Ambulatory Visit: Payer: Self-pay

## 2020-01-10 ENCOUNTER — Other Ambulatory Visit (HOSPITAL_COMMUNITY)
Admission: RE | Admit: 2020-01-10 | Discharge: 2020-01-10 | Disposition: A | Discharge status: Home / Self Care | Payer: Medicaid Other | Source: Ambulatory Visit

## 2020-01-10 VITALS — BP 134/80 | HR 73 | Temp 99.1°F | Wt 197.0 lb

## 2020-01-10 DIAGNOSIS — F32A Depression, unspecified: Secondary | ICD-10-CM

## 2020-01-10 DIAGNOSIS — F411 Generalized anxiety disorder: Secondary | ICD-10-CM

## 2020-01-10 DIAGNOSIS — N898 Other specified noninflammatory disorders of vagina: Secondary | ICD-10-CM | POA: Diagnosis present

## 2020-01-10 DIAGNOSIS — Z3009 Encounter for other general counseling and advice on contraception: Secondary | ICD-10-CM | POA: Insufficient documentation

## 2020-01-10 DIAGNOSIS — Z3202 Encounter for pregnancy test, result negative: Secondary | ICD-10-CM

## 2020-01-10 DIAGNOSIS — Z113 Encounter for screening for infections with a predominantly sexual mode of transmission: Secondary | ICD-10-CM | POA: Diagnosis present

## 2020-01-10 MED ORDER — PHEXXI 1.8-1-0.4 % VA GEL: 1.0000 | VAGINAL | 4 refills | Status: DC | PRN

## 2020-01-10 NOTE — Progress Notes (Signed)
Screening scores high today and Gerrit Heck CNM made aware before seeing pt

## 2020-01-10 NOTE — Patient Instructions (Signed)
Lactic acid; Citric acid; Potassium bitartrate vaginal gel What is this medicine? LACTIC ACID; CITRIC ACID; POTASSIUM BITARTRATE (LAK tuhk AS id; SIH trik AS id; poe TASS ee um bi TAAR treit) is used for birth control when you have sexual intercourse to help prevent pregnancy. This medicine may be used for other purposes; ask your health care provider or pharmacist if you have questions. COMMON BRAND NAME(S): PHEXXI What should I tell my health care provider before I take this medicine? They need to know if you have any of these conditions:  frequent kidney or urinary tract infections  HIV or AIDS  pelvic or vaginal infection  sexually transmitted disease, like herpes, gonorrhea, or chlamydia  an unusual or allergic reaction to lactic acid, citric acid, potassium bitartrate, other medicines, foods, dyes, or preservatives  pregnant or trying to get pregnant  breast-feeding How should I use this medicine? This medicine is for use in the vagina only. Follow the directions on the prescription label. Wash hands before and after use. To prevent pregnancy, it is very important that this product is used properly. This product must be applied before sexual contact begins. Do not use it in the rectum. Make sure you carefully read and follow the instructions that come with the product. If you have vaginal sex more than 1 time within 1 hour, you must use a new applicator. Use exactly as directed. Talk to your pediatrician regarding the use of this medicine in children. Special care may be needed. Overdosage: If you think you have taken too much of this medicine contact a poison control center or emergency room at once. NOTE: This medicine is only for you. Do not share this medicine with others. What if I miss a dose? Use before each time you have sexual intercourse as directed on the label. If you miss a dose, you may become pregnant. What may interact with this medicine? Interactions are not  expected. This birth control may be used with other medicines used in the vagina to treat infections including miconazole, metronidazole, and tioconazole. If you have questions ask your doctor or pharmacist. Do not use any other vaginal products at the same time without talking to your health care professional. This list may not describe all possible interactions. Give your health care provider a list of all the medicines, herbs, non-prescription drugs, or dietary supplements you use. Also tell them if you smoke, drink alcohol, or use illegal drugs. Some items may interact with your medicine. What should I watch for while using this medicine? This product does not protect you against HIV infection (AIDS) or other sexually transmitted diseases. This product may be used at any time of your menstrual cycle. This product may be used as soon as your healthcare provider tells you it is safe for you to have sex after childbirth, abortion, or miscarriage. This product may be used with most hormonal birth control methods; a vaginal diaphragm; or latex, polyurethane and polyisoprene condoms. Avoid using this product with contraceptive vaginal rings. What side effects may I notice from receiving this medicine? Side effects that you should report to your doctor or health care professional as soon as possible:  allergic reactions like skin rash, itching or hives, swelling  signs and symptoms of infection like fever; chills; pelvic pain; cloudy urine; pain or trouble passing urine  vaginal discharge, itching or odor  vaginal irritation or burning Side effects that usually do not require medical attention (report these to your doctor or health care professional   if they continue or are bothersome):  mild vaginal irritation This list may not describe all possible side effects. Call your doctor for medical advice about side effects. You may report side effects to FDA at 1-800-FDA-1088. Where should I keep my  medicine? Keep out of the reach of children. Store at room temperature between 15 and 30 degrees C (59 and 86 degrees F). Keep in the foil pouch until ready for use. Follow the directions on the product label. Throw away any unused medicine after the expiration date. NOTE: This sheet is a summary. It may not cover all possible information. If you have questions about this medicine, talk to your doctor, pharmacist, or health care provider.  2020 Elsevier/Gold Standard (2018-08-13 09:34:36)  

## 2020-01-10 NOTE — Progress Notes (Signed)
   GYNECOLOGY PROBLEM OFFICE VISIT NOTE  History:  Kristin Brock is a 24 y.o. [id number] here today for contraception counseling. She does not desire more children in the next 18 months and would like to start a method.  She desires method "that doesn't make me crazy."  Patient denies any abnormal vaginal discharge, bleeding, pelvic pain or other concerns. Patient reports some vaginal discharge currently that increases when she switches soaps.  She denies odor or irritation.   Past Medical History:  Diagnosis Date  . Asthma     Past Surgical History:  Procedure Laterality Date  . INDUCED ABORTION    . NO PAST SURGERIES      The following portions of the patient's history were reviewed and updated as appropriate: allergies, current medications, past family history, past medical history, past social history, past surgical history and problem list.   Health Maintenance:  Normal pap and negative HRHPV on July 2020.  No mammogram d/t age.   Review of Systems:  Genito-Urinary ROS: no dysuria, trouble voiding, or hematuria Gastrointestinal ROS: no abdominal pain, change in bowel habits, or black or bloody stools Objective:  Vitals: BP 134/80   Pulse 73   Temp 99.1 F (37.3 C)   Wt 197 lb (89.4 kg)   LMP  (LMP Unknown)   BMI 33.81 kg/m   Physical Exam: Physical Exam Constitutional:      Appearance: Normal appearance. She is obese.  HENT:     Head: Normocephalic and atraumatic.  Eyes:     Conjunctiva/sclera: Conjunctivae normal.  Cardiovascular:     Rate and Rhythm: Normal rate and regular rhythm.  Pulmonary:     Effort: Pulmonary effort is normal.     Breath sounds: Normal breath sounds.  Abdominal:     Palpations: Abdomen is soft.     Tenderness: There is no abdominal tenderness.  Musculoskeletal:        General: Normal range of motion.     Cervical back: Normal range of motion.  Neurological:     Mental Status: She is alert and oriented to person, place, and time.   Skin:    General: Skin is warm and dry.     Comments: Cut noted on right palm and left cheek.  Provider did not inquire.   Psychiatric:        Mood and Affect: Mood normal.        Behavior: Behavior normal.  Vitals reviewed.      Labs and Imaging: No results found.  Assessment & Plan:  24 year old Contraception counseling and Mgmt GAD 15 PHQ 17 Vaginal Discharge  STD Screen  -Reviewed birth control methods by tiered approach. -Given information pamphlets for Nexplanon, Liletta, and Paragard -Discussed new non-hormonal method, Phexxi, for usage prior to sex. -Rx sent to pharmacy. -Patient encouraged to call if she desires change in method. -Reviewed self collection of CV for STD testing and VD complaint. -Patient UPT returns negative. -Referral for [facility] sent for scores as above. Patient admits to feelings of depression and anxiety.    Total face-to-face time with patient: 15 minutes   Gerrit Heck, [place] 01/10/2020 4:27 PM

## 2020-01-11 LAB — POCT PREGNANCY, URINE: Preg Test, Ur: NEGATIVE

## 2020-01-11 NOTE — BH Specialist Note (Signed)
error 

## 2020-01-12 LAB — CERVICOVAGINAL ANCILLARY ONLY
Bacterial Vaginitis (gardnerella): POSITIVE — AB
Candida Glabrata: NEGATIVE
Candida Vaginitis: NEGATIVE
Chlamydia: NEGATIVE
Comment: NEGATIVE
Comment: NEGATIVE
Comment: NEGATIVE
Comment: NEGATIVE
Comment: NEGATIVE
Comment: NORMAL
Neisseria Gonorrhea: NEGATIVE
Trichomonas: NEGATIVE

## 2020-01-13 MED ORDER — METRONIDAZOLE 500 MG PO TABS: 500.0000 mg | ORAL_TABLET | Freq: Two times a day (BID) | ORAL | 0 refills | Status: DC

## 2020-01-13 NOTE — Addendum Note (Signed)
Addended by: Gerrit Heck L on: 01/13/2020 04:38 PM   Modules accepted: Orders

## 2020-01-21 ENCOUNTER — Ambulatory Visit: Payer: Medicaid Other | Admitting: Clinical

## 2020-01-21 DIAGNOSIS — F32A Depression, unspecified: Secondary | ICD-10-CM

## 2020-01-21 NOTE — BH Specialist Note (Signed)
Integrated Behavioral Health via Telemedicine Video (Caregility) Visit  01/21/2020 Kiana Hollar [id number]   I reviewed patient visit with the [facility] Intern, and I concur with the treatment plan, as documented in the [facility] Intern note.   No charge for this visit due to [facility] Intern seeing patient.  Hulda Marin, MSW, LCSW Integrated Behavioral Health Clinician Center for [employer] at [facility]  Number of Integrated Behavioral Health visits: 1 Session Start time: 9:15am  Session End time: 9:53pm Total time: 40  minutes  Referring Provider: Luna Kitchens, CNM Type of Service: Individual Patient/Family location: home [facility] Provider location: [facility] All persons participating in visit: [facility] Intern, pt   I connected with Kristin Brock and/or Lelon Perla patient by a video enabled telemedicine application (Caregility) and verified that I am speaking with the correct person using two identifiers.   Discussed confidentiality: Yes   Confirmed demographics & insurance:  Yes   I discussed that engaging in this virtual visit, they consent to the provision of behavioral healthcare and the services will be billed under their insurance.   Patient and/or legal guardian expressed understanding and consented to virtual visit: Yes   PRESENTING CONCERNS: Patient and/or family reports the following symptoms/concerns: anger, anxiety, depression Duration of problem: ongoing; Severity of problem: severe  STRENGTHS (Protective Factors/Coping Skills): Sense of purpose  ASSESSMENT: Patient currently experiencing symptoms of anxiety and depression along with severe anger .    GOALS ADDRESSED: Patient will: 1.  Reduce symptoms of: mood instability  2.  Increase knowledge and/or ability of: coping skills and healthy habits  3.  Demonstrate ability to: Increase healthy adjustment to current life circumstances and Decrease self-medicating behaviors   Progress of  Goals: Ongoing  INTERVENTIONS: Interventions utilized:  Solution-Focused Strategies, Supportive Counseling, Sleep Hygiene and Link to [employer] Standardized Assessments completed & reviewed: GAD-7 and PHQ 9   OUTCOME: Patient Response: Pt states she is not interested in counseling services as they do not work for her.Pt agrees to reach out to [facility] for additional resources and support and discuss psychiatric services.    PLAN: 1. Follow up with behavioral health clinician on : as needed 2. Behavioral recommendations: Contact [facility]  3. Referral(s): Psychiatrist  I discussed the assessment and treatment plan with the patient and/or parent/guardian. They were provided an opportunity to ask questions and all were answered. They agreed with the plan and demonstrated an understanding of the instructions.   They were advised to call back or seek an in-person evaluation as appropriate.  I discussed that the purpose of this visit is to provide behavioral health care while limiting exposure to the novel coronavirus.  Discussed there is a possibility of technology failure and discussed alternative modes of communication if that failure occurs.   [employer]

## 2020-02-26 ENCOUNTER — Emergency Department (HOSPITAL_COMMUNITY): Payer: Medicaid Other

## 2020-02-26 ENCOUNTER — Encounter (HOSPITAL_COMMUNITY): Payer: Self-pay

## 2020-02-26 ENCOUNTER — Other Ambulatory Visit: Payer: Self-pay

## 2020-02-26 ENCOUNTER — Emergency Department (HOSPITAL_COMMUNITY)
Admission: EM | Admit: 2020-02-26 | Discharge: 2020-02-26 | Disposition: A | Discharge status: Home / Self Care | Payer: Medicaid Other | Attending: Emergency Medicine | Admitting: Emergency Medicine

## 2020-02-26 DIAGNOSIS — S0990XA Unspecified injury of head, initial encounter: Secondary | ICD-10-CM | POA: Diagnosis present

## 2020-02-26 DIAGNOSIS — J45909 Unspecified asthma, uncomplicated: Secondary | ICD-10-CM | POA: Diagnosis not present

## 2020-02-26 DIAGNOSIS — Z23 Encounter for immunization: Secondary | ICD-10-CM | POA: Insufficient documentation

## 2020-02-26 DIAGNOSIS — Y92410 Unspecified street and highway as the place of occurrence of the external cause: Secondary | ICD-10-CM | POA: Diagnosis not present

## 2020-02-26 DIAGNOSIS — S3991XA Unspecified injury of abdomen, initial encounter: Secondary | ICD-10-CM | POA: Diagnosis not present

## 2020-02-26 DIAGNOSIS — S4992XA Unspecified injury of left shoulder and upper arm, initial encounter: Secondary | ICD-10-CM | POA: Insufficient documentation

## 2020-02-26 DIAGNOSIS — M542 Cervicalgia: Secondary | ICD-10-CM | POA: Diagnosis not present

## 2020-02-26 DIAGNOSIS — S8991XA Unspecified injury of right lower leg, initial encounter: Secondary | ICD-10-CM | POA: Insufficient documentation

## 2020-02-26 DIAGNOSIS — S8992XA Unspecified injury of left lower leg, initial encounter: Secondary | ICD-10-CM | POA: Diagnosis not present

## 2020-02-26 DIAGNOSIS — S299XXA Unspecified injury of thorax, initial encounter: Secondary | ICD-10-CM | POA: Diagnosis not present

## 2020-02-26 DIAGNOSIS — F17210 Nicotine dependence, cigarettes, uncomplicated: Secondary | ICD-10-CM | POA: Diagnosis not present

## 2020-02-26 DIAGNOSIS — S6992XA Unspecified injury of left wrist, hand and finger(s), initial encounter: Secondary | ICD-10-CM | POA: Insufficient documentation

## 2020-02-26 LAB — I-STAT CHEM 8, ED
BUN: 4 mg/dL — ABNORMAL LOW (ref 6–20)
Calcium, Ion: 1.2 mmol/L (ref 1.15–1.40)
Chloride: 109 mmol/L (ref 98–111)
Creatinine, Ser: 1 mg/dL (ref 0.44–1.00)
Glucose, Bld: 108 mg/dL — ABNORMAL HIGH (ref 70–99)
HCT: 37 % (ref 36.0–46.0)
Hemoglobin: 12.6 g/dL (ref 12.0–15.0)
Potassium: 4 mmol/L (ref 3.5–5.1)
Sodium: 143 mmol/L (ref 135–145)
TCO2: 24 mmol/L (ref 22–32)

## 2020-02-26 LAB — COMPREHENSIVE METABOLIC PANEL
ALT: 18 U/L (ref 0–44)
AST: 21 U/L (ref 15–41)
Albumin: 4.2 g/dL (ref 3.5–5.0)
Alkaline Phosphatase: 69 U/L (ref 38–126)
Anion gap: 12 (ref 5–15)
BUN: 6 mg/dL (ref 6–20)
CO2: 22 mmol/L (ref 22–32)
Calcium: 9.3 mg/dL (ref 8.9–10.3)
Chloride: 107 mmol/L (ref 98–111)
Creatinine, Ser: 0.82 mg/dL (ref 0.44–1.00)
GFR, Estimated: >60 mL/min (ref >60)
Glucose, Bld: 110 mg/dL — ABNORMAL HIGH (ref 70–99)
Potassium: 4 mmol/L (ref 3.5–5.1)
Sodium: 141 mmol/L (ref 135–145)
Total Bilirubin: 0.5 mg/dL (ref 0.3–1.2)
Total Protein: 8.5 g/dL — ABNORMAL HIGH (ref 6.5–8.1)

## 2020-02-26 LAB — CBC
HCT: 37 % (ref 36.0–46.0)
Hemoglobin: 11.5 g/dL — ABNORMAL LOW (ref 12.0–15.0)
MCH: 26.9 pg (ref 26.0–34.0)
MCHC: 31.1 g/dL (ref 30.0–36.0)
MCV: 86.7 fL (ref 80.0–100.0)
Platelets: 325 10*3/uL (ref 150–400)
RBC: 4.27 MIL/uL (ref 3.87–5.11)
RDW: 16.1 % — ABNORMAL HIGH (ref 11.5–15.5)
WBC: 6.7 10*3/uL (ref 4.0–10.5)
nRBC: 0 % (ref 0.0–0.2)

## 2020-02-26 MED ORDER — CYCLOBENZAPRINE HCL 10 MG PO TABS
5.0000 mg | ORAL_TABLET | Freq: Two times a day (BID) | ORAL | 0 refills | Status: DC | PRN
Start: 2020-02-26 — End: 2020-07-27

## 2020-02-26 MED ORDER — SODIUM CHLORIDE 0.9% FLUSH: 3.0000 mL | INTRAVENOUS | Status: DC | PRN

## 2020-02-26 MED ORDER — SODIUM CHLORIDE (PF) 0.9 % IJ SOLN
INTRAMUSCULAR | Status: AC
  Administered 2020-02-26: 09:00:00 3 mL via INTRAVENOUS
  Filled 2020-02-26: qty 50

## 2020-02-26 MED ORDER — FENTANYL CITRATE (PF) 100 MCG/2ML IJ SOLN
50.0000 ug | Freq: Once | INTRAMUSCULAR | Status: AC
Start: 2020-02-26 — End: 2020-02-26
  Administered 2020-02-26: 08:00:00 50 ug via INTRAVENOUS
  Filled 2020-02-26: qty 2

## 2020-02-26 MED ORDER — ONDANSETRON HCL 4 MG/2ML IJ SOLN
4.0000 mg | Freq: Once | INTRAMUSCULAR | Status: AC
  Administered 2020-02-26: 08:00:00 4 mg via INTRAVENOUS
  Filled 2020-02-26: qty 2

## 2020-02-26 MED ORDER — SODIUM CHLORIDE 0.9% FLUSH
3.0000 mL | Freq: Two times a day (BID) | INTRAVENOUS | Status: DC
Start: 2020-02-26 — End: 2020-02-26

## 2020-02-26 MED ORDER — NAPROXEN 375 MG PO TABS: 375.0000 mg | ORAL_TABLET | Freq: Two times a day (BID) | ORAL | 0 refills | Status: DC

## 2020-02-26 MED ORDER — SODIUM CHLORIDE 0.9 % IV SOLN: 250.0000 mL | INTRAVENOUS | Status: DC | PRN

## 2020-02-26 MED ORDER — TETANUS-DIPHTH-ACELL PERTUSSIS 5-2.5-18.5 LF-MCG/0.5 IM SUSY
0.5000 mL | PREFILLED_SYRINGE | Freq: Once | INTRAMUSCULAR | Status: AC
Start: 2020-02-26 — End: 2020-02-26
  Administered 2020-02-26: 08:00:00 0.5 mL via INTRAMUSCULAR
  Filled 2020-02-26: qty 0.5

## 2020-02-26 MED ORDER — IOHEXOL 300 MG/ML  SOLN
100.0000 mL | Freq: Once | INTRAMUSCULAR | Status: AC | PRN
  Administered 2020-02-26: 09:00:00 100 mL via INTRAVENOUS

## 2020-02-26 NOTE — ED Notes (Signed)
Patient I stat beta was < 5.0 IU/L
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
## 2020-02-26 NOTE — Discharge Instructions (Signed)
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
## 2020-02-28 LAB — I-STAT BETA HCG BLOOD, ED (MC, WL, AP ONLY): I-stat hCG, quantitative: <5 m[IU]/mL (ref <5)

## 2020-03-18 NOTE — L&D Delivery Note (Addendum)
OB/GYN Faculty Practice Delivery Note  Kristin Brock is a 25 y.o. [id number] at [redacted]w[redacted]d. She was admitted for SOL.   ROM: 0h 72m with clear fluid GBS Status: Negative Maximum Maternal Temperature: 98.1*F  Labor Progress: Patient presented in SOL and progressed to complete. She received pitocin for about 10 minutes prior to delivery.   Delivery Date/Time: 11/12/20 at 1509 Delivery: Called to room and patient was complete and pushing. Head delivered ROA. No nuchal cord present. Shoulder and body delivered in usual fashion. Infant with spontaneous cry, placed on mother's abdomen, dried and stimulated. Cord clamped x 2 after 1-minute delay, and cut by FOB. Cord blood drawn. Placenta delivered spontaneously with gentle cord traction. Fundus firm with massage and Pitocin. Labia, perineum, vagina, and cervix inspected inspected with a hemostatic left labial superficial laceration.   Placenta: spontaneous, 3VC, intact Complications: none Lacerations: hemostatic left labial laceration no repair needed EBL: 200cc Analgesia: epidural  Postpartum Planning [ ]  message to sent to schedule follow-up  [ ]  vaccines UTD  Infant: viable female  APGARs 9,9  weight pending medical chart  , MD [facility], PGY-3 11/12/2020, 3:30 PM  I was present and gloved with the resident for the entire delivery. I agree with the note above.   [facility] DNP, CNM  11/12/20  3:38 PM

## 2020-06-07 ENCOUNTER — Other Ambulatory Visit: Payer: Self-pay

## 2020-06-07 ENCOUNTER — Encounter (HOSPITAL_COMMUNITY): Payer: Self-pay | Admitting: *Deleted

## 2020-06-07 ENCOUNTER — Inpatient Hospital Stay (HOSPITAL_COMMUNITY)
Admission: AD | Admit: 2020-06-07 | Discharge: 2020-06-08 | Disposition: A | Discharge status: Home / Self Care | Payer: Medicaid Other | Attending: Obstetrics & Gynecology | Admitting: Obstetrics & Gynecology

## 2020-06-07 ENCOUNTER — Inpatient Hospital Stay (HOSPITAL_BASED_OUTPATIENT_CLINIC_OR_DEPARTMENT_OTHER): Payer: Medicaid Other

## 2020-06-07 DIAGNOSIS — F17210 Nicotine dependence, cigarettes, uncomplicated: Secondary | ICD-10-CM | POA: Diagnosis not present

## 2020-06-07 DIAGNOSIS — R102 Pelvic and perineal pain: Secondary | ICD-10-CM

## 2020-06-07 DIAGNOSIS — Z79899 Other long term (current) drug therapy: Secondary | ICD-10-CM | POA: Diagnosis not present

## 2020-06-07 DIAGNOSIS — O99332 Smoking (tobacco) complicating pregnancy, second trimester: Secondary | ICD-10-CM | POA: Insufficient documentation

## 2020-06-07 DIAGNOSIS — O209 Hemorrhage in early pregnancy, unspecified: Secondary | ICD-10-CM

## 2020-06-07 DIAGNOSIS — Z3A16 16 weeks gestation of pregnancy: Secondary | ICD-10-CM | POA: Insufficient documentation

## 2020-06-07 DIAGNOSIS — O2342 Unspecified infection of urinary tract in pregnancy, second trimester: Secondary | ICD-10-CM | POA: Diagnosis not present

## 2020-06-07 DIAGNOSIS — O208 Other hemorrhage in early pregnancy: Secondary | ICD-10-CM | POA: Diagnosis not present

## 2020-06-07 DIAGNOSIS — R109 Unspecified abdominal pain: Secondary | ICD-10-CM | POA: Diagnosis not present

## 2020-06-07 DIAGNOSIS — N390 Urinary tract infection, site not specified: Secondary | ICD-10-CM

## 2020-06-07 DIAGNOSIS — O2340 Unspecified infection of urinary tract in pregnancy, unspecified trimester: Secondary | ICD-10-CM | POA: Diagnosis present

## 2020-06-07 DIAGNOSIS — O4692 Antepartum hemorrhage, unspecified, second trimester: Secondary | ICD-10-CM

## 2020-06-07 DIAGNOSIS — O26899 Other specified pregnancy related conditions, unspecified trimester: Secondary | ICD-10-CM

## 2020-06-07 LAB — CBC
HCT: 31.6 % — ABNORMAL LOW (ref 36.0–46.0)
Hemoglobin: 10.5 g/dL — ABNORMAL LOW (ref 12.0–15.0)
MCH: 28.8 pg (ref 26.0–34.0)
MCHC: 33.2 g/dL (ref 30.0–36.0)
MCV: 86.8 fL (ref 80.0–100.0)
Platelets: 273 10*3/uL (ref 150–400)
RBC: 3.64 MIL/uL — ABNORMAL LOW (ref 3.87–5.11)
RDW: 16.2 % — ABNORMAL HIGH (ref 11.5–15.5)
WBC: 14.8 10*3/uL — ABNORMAL HIGH (ref 4.0–10.5)
nRBC: 0 % (ref 0.0–0.2)

## 2020-06-07 LAB — WET PREP, GENITAL
Sperm: NONE SEEN
Trich, Wet Prep: NONE SEEN
Yeast Wet Prep HPF POC: NONE SEEN

## 2020-06-07 LAB — POC URINE PREG, ED: Preg Test, Ur: POSITIVE — AB

## 2020-06-07 MED ORDER — IBUPROFEN 600 MG PO TABS
600.0000 mg | ORAL_TABLET | Freq: Once | ORAL | Status: AC
  Administered 2020-06-07: 600 mg via ORAL
  Filled 2020-06-07: qty 1

## 2020-06-07 NOTE — ED Triage Notes (Signed)
Pt reports ~4 month pregnant with lower abd pain x1 week and vaginal bleeding that started last night. LMP 02/10/2020

## 2020-06-07 NOTE — MAU Provider Note (Signed)
Chief Complaint:  Vaginal Bleeding   Event Date/Time   First Provider Initiated Contact with Patient 06/07/20 2037     HPI: Kristin Brock is a 25 y.o. [id number] at 70w6dho presents to maternity admissions reporting vaginal bleeding since last night, heavy then, lighter now. Also has abdominal cramping for the past week. . She denies LOF, vaginal itching/burning, urinary symptoms, h/a, dizziness, n/v, diarrhea, constipation or fever/chills.   Vaginal Bleeding The patient's primary symptoms include pelvic pain and vaginal bleeding. The patient's pertinent negatives include no genital itching, genital lesions or genital odor. This is a new problem. The current episode started yesterday. The problem occurs intermittently. The problem has been gradually improving. The problem affects both sides. She is pregnant. Associated symptoms include abdominal pain (like menstrual cramps) and back pain. Pertinent negatives include no constipation, diarrhea, fever, nausea or vomiting. The vaginal discharge was bloody. The vaginal bleeding is typical of menses. She has not been passing clots. She has not been passing tissue. Nothing aggravates the symptoms. She has tried nothing for the symptoms.    RN Note: Kristin Prestonis a 25y.o. at 141w6dy LMP here in MAU reporting: Last night she bleed through her pants. Today she continued to bleed through today. Is not wearing a pad now but she saw some of it when she went to the restroom. Reports abdominal cramping for the past 1.5 week.  Past Medical History: Past Medical History:  Diagnosis Date  . Asthma     Past obstetric history: OB History  Gravida Para Term Preterm AB Living  _0 0 1 2  SAB IAB Ectopic Multiple Live Births    1   0 2    # Outcome Date GA Lbr Len/2nd Weight Sex Delivery Anes PTL Lv  4 Current           3 IAB 11/19/19          2 Term 04/24/19 4132w0d:11 / 00:06 3545 g M Vag-Spont EPI  LIV     Birth Comments: WDL  1 Term 03/22/14   3175  g F Vag-Spont   LIV     Birth Comments: fainting, asthma exacerbations    Past Surgical History: Past Surgical History:  Procedure Laterality Date  . INDUCED ABORTION    . NO PAST SURGERIES      Family History: Family History  Problem Relation Age of Onset  . Cancer Maternal Grandmother   . Pulmonary embolism Paternal Grandmother     Social History: Social History   Tobacco Use  . Smoking status: Current Every Day Smoker    Packs/day: 0.25    Types: Cigarettes  . Smokeless tobacco: Never Used  Vaping Use  . Vaping Use: Some days  Substance Use Topics  . Alcohol use: Yes    Alcohol/week: 3.0 standard drinks    Types: 3 Glasses of wine per week    Comment: daily  . Drug use: Not Currently    Types: Marijuana    Comment: last used 2019    Allergies: No Known Allergies  Meds:  Medications Prior to Admission  Medication Sig Dispense Refill Last Dose  . acetaminophen (TYLENOL) 325 MG tablet Take 2 tablets (650 mg total) by mouth every 4 (four) hours as needed (for pain scale < 4). (Patient not taking: No sig reported) 30 tablet 0   . Blood Pressure Monitoring (BLOOD PRESSURE KIT) DEVI 1 Device by Does not apply route daily. ICD 10: Z34.00 1 Device 0   .  cephALEXin (KEFLEX) 500 MG capsule Take 1 capsule (500 mg total) by mouth 4 (four) times daily. (Patient not taking: No sig reported) 28 capsule 0   . cyclobenzaprine (FLEXERIL) 10 MG tablet Take 0.5-1 tablets (5-10 mg total) by mouth 2 (two) times daily as needed for muscle spasms. 20 tablet 0   . ferrous sulfate 325 (65 FE) MG tablet Take 1 tablet (325 mg total) by mouth daily with breakfast. (Patient not taking: No sig reported) 30 tablet 3   . ibuprofen (ADVIL) 600 MG tablet Take 1 tablet (600 mg total) by mouth every 6 (six) hours. (Patient not taking: No sig reported) 30 tablet 0   . Lactic Ac-Citric Ac-Pot Bitart (PHEXXI) 1.8-1-0.4 % GEL Place 1 applicator vaginally as needed. No more than one hour prior to sexual  activity.  Place additional applicators before each sexual act. 50 g 4   . metroNIDAZOLE (FLAGYL) 500 MG tablet Take 1 tablet (500 mg total) by mouth 2 (two) times daily. 14 tablet 0   . naproxen (NAPROSYN) 375 MG tablet Take 1 tablet (375 mg total) by mouth 2 (two) times daily with a meal. 20 tablet 0   . Prenatal MV & Min w/FA-DHA (PRENATAL ADULT GUMMY/DHA/FA) 0.4-25 MG CHEW Chew 2 tablets by mouth daily. (Patient not taking: No sig reported) 60 tablet 11   . Prenatal Vit-Fe Fumarate-FA (PREPLUS) 27-1 MG TABS Take 1 tablet by mouth daily. (Patient not taking: No sig reported) 30 tablet 6     I have reviewed patient's Past Medical Hx, Surgical Hx, Family Hx, Social Hx, medications and allergies.   ROS:  Review of Systems  Constitutional: Negative for fever.  Gastrointestinal: Positive for abdominal pain (like menstrual cramps). Negative for constipation, diarrhea, nausea and vomiting.  Genitourinary: Positive for pelvic pain and vaginal bleeding.  Musculoskeletal: Positive for back pain.   Other systems negative  Physical Exam   Patient Vitals for the past 24 hrs:  BP Temp Temp src Pulse Resp SpO2 Height Weight  06/07/20 2028 (!) 115/59 98.4 F (36.9 C) Oral 74 18 100 % _0  (1.626 m) 85.3 kg  06/07/20 2015 - - - - - 100 % - -  06/07/20 1916 113/79 97.8 F (36.6 C) Oral 78 18 100 % - -   Constitutional: Well-developed, well-nourished female in no acute distress.  Cardiovascular: normal rate and rhythm Respiratory: normal effort, clear to auscultation bilaterally GI: Abd soft, mildly tender over lower abdomen but could not elicit any  rebound or guarding. MS: Extremities nontender, no edema, normal ROM Neurologic: Alert and oriented x 4.  GU: Neg CVAT.  PELVIC EXAM: Cervix pink, visually closed, without lesion, small amount of dark red blood,  vaginal walls and external genitalia normal Bimanual exam: Cervix firm, posterior, Irregular edges to cervix (?prior lacerations),  neg  CMT, uterus nontender, Fundal Height consistent with dates, adnexa without tenderness, enlargement, or mass   FHT:  166   Labs: Blood type B+ Results for orders placed or performed during the hospital encounter of 06/07/20 (from the past 24 hour(s))  POC Urine Pregnancy, ED (not at [facility])     Status: Abnormal   Collection Time: 06/07/20  7:44 PM  Result Value Ref Range   Preg Test, Ur POSITIVE (A) NEGATIVE  Wet prep, genital     Status: Abnormal   Collection Time: 06/07/20  9:57 PM   Specimen: Vaginal  Result Value Ref Range   Yeast Wet Prep HPF POC NONE SEEN NONE SEEN  Trich, Wet Prep NONE SEEN NONE SEEN   Clue Cells Wet Prep HPF POC PRESENT (A) NONE SEEN   WBC, Wet Prep HPF POC FEW (A) NONE SEEN   Sperm NONE SEEN   CBC     Status: Abnormal   Collection Time: 06/07/20 10:16 PM  Result Value Ref Range   WBC 14.8 (H) 4.0 - 10.5 K/uL   RBC 3.64 (L) 3.87 - 5.11 MIL/uL   Hemoglobin 10.5 (L) 12.0 - 15.0 g/dL   HCT 31.6 (L) 36.0 - 46.0 %   MCV 86.8 80.0 - 100.0 fL   MCH 28.8 26.0 - 34.0 pg   MCHC 33.2 30.0 - 36.0 g/dL   RDW 16.2 (H) 11.5 - 15.5 %   Platelets 273 150 - 400 K/uL   nRBC 0.0 0.0 - 0.2 %  Urinalysis, Routine w reflex microscopic Urine, Clean Catch     Status: Abnormal   Collection Time: 06/07/20 11:10 PM  Result Value Ref Range   Color, Urine AMBER (A) YELLOW   APPearance CLOUDY (A) CLEAR   Specific Gravity, Urine 1.019 1.005 - 1.030   pH 6.0 5.0 - 8.0   Glucose, UA NEGATIVE NEGATIVE mg/dL   Hgb urine dipstick MODERATE (A) NEGATIVE   Bilirubin Urine NEGATIVE NEGATIVE   Ketones, ur 5 (A) NEGATIVE mg/dL   Protein, ur 30 (A) NEGATIVE mg/dL   Nitrite POSITIVE (A) NEGATIVE   Leukocytes,Ua TRACE (A) NEGATIVE   RBC / HPF 0-5 0 - 5 RBC/hpf   WBC, UA 0-5 0 - 5 WBC/hpf   Bacteria, UA MANY (A) NONE SEEN   Squamous Epithelial / LPF 6-10 0 - 5   Mucus PRESENT     Imaging:  Placenta posterior, no previa or abruption noted Cx length 5.4cm Best EDC 16 6/7 wks    11/16/20  MAU Course/MDM: I have ordered labs and reviewed results.  UA suggestive of UTI, will culture and treat Treatments in MAU included ibuprofen.    Assessment: 1. Vaginal bleeding affecting early pregnancy   2. Bleeding in early pregnancy     Plan: Discharge home Rx Prenatal vitamins Rx Keflex for UTI Pelvic rest Follow up in Office for prenatal visits   Encouraged to return if she develops worsening of symptoms, increase in pain, fever, or other concerning symptoms.  Pt stable at time of discharge.  Hansel Feinstein CNM, MSN Certified Nurse-Midwife 06/07/2020 8:37 PM

## 2020-06-07 NOTE — ED Provider Notes (Signed)
Emergency Medicine Provider OB Triage Evaluation Note  Kristin Brock is a 25 y.o. female, [id number], at Unknown gestation who presents to the emergency department with complaints of low abdominal pain x1 week with vaginal bleeding that started yesterday. + home pregnancy test. LMP 02/10/2020.   Review of  Systems  Positive: abdominal pain, vaginal bleeding Negative: fever, chills  Physical Exam  BP 113/79 (BP Location: Right Arm)   Pulse 78   Temp 97.8 F (36.6 C) (Oral)   Resp 18   LMP 02/10/2020   SpO2 100%  General: Awake, no distress  HEENT: Atraumatic  Resp: Normal effort  Cardiac: Normal rate Abd: Nondistended, with lower abdominal tenderness MSK: Moves all extremities without difficulty Neuro: Speech clear  Medical Decision Making  Pt evaluated for pregnancy concern and is stable for transfer to MAU. Pt is in agreement with plan for transfer.  7:28 PM Discussed with MAU APP, Kristin Brock, who accepts patient in transfer.  Clinical Impression  No diagnosis found.     Kristin Brock 06/07/20 1959    Kristin Loll, MD 06/07/20 [phone]

## 2020-06-07 NOTE — MAU Note (Addendum)
. °.  Kristin Brock is a 25 y.o. at [redacted]w[redacted]d by LMP here in MAU reporting: Last night she bleed through her pants. Today she continued to bleed through today. Is not wearing a pad now but she saw some of it when she went to the restroom. Reports abdominal cramping for the past 1.5 week. LMP: 02/09/2021 Pain score: 9/10 Vitals:   06/07/20 1916  BP: 113/79  Pulse: 78  Resp: 18  Temp: 97.8 F (36.6 C)  SpO2: 100%     FHT: 160

## 2020-06-08 DIAGNOSIS — Z3A16 16 weeks gestation of pregnancy: Secondary | ICD-10-CM

## 2020-06-08 DIAGNOSIS — O26892 Other specified pregnancy related conditions, second trimester: Secondary | ICD-10-CM

## 2020-06-08 DIAGNOSIS — Z3687 Encounter for antenatal screening for uncertain dates: Secondary | ICD-10-CM

## 2020-06-08 DIAGNOSIS — O4692 Antepartum hemorrhage, unspecified, second trimester: Secondary | ICD-10-CM

## 2020-06-08 DIAGNOSIS — O2342 Unspecified infection of urinary tract in pregnancy, second trimester: Secondary | ICD-10-CM

## 2020-06-08 DIAGNOSIS — O208 Other hemorrhage in early pregnancy: Secondary | ICD-10-CM

## 2020-06-08 DIAGNOSIS — O0932 Supervision of pregnancy with insufficient antenatal care, second trimester: Secondary | ICD-10-CM

## 2020-06-08 DIAGNOSIS — Z3A17 17 weeks gestation of pregnancy: Secondary | ICD-10-CM

## 2020-06-08 LAB — URINALYSIS, ROUTINE W REFLEX MICROSCOPIC
Bilirubin Urine: NEGATIVE
Glucose, UA: NEGATIVE mg/dL
Ketones, ur: 5 mg/dL — AB
Nitrite: POSITIVE — AB
Protein, ur: 30 mg/dL — AB
Specific Gravity, Urine: 1.019 (ref 1.005–1.030)
pH: 6 (ref 5.0–8.0)

## 2020-06-08 LAB — GC/CHLAMYDIA PROBE AMP (~~LOC~~) NOT AT ARMC
Chlamydia: NEGATIVE
Comment: NEGATIVE
Comment: NORMAL
Neisseria Gonorrhea: NEGATIVE

## 2020-06-08 MED ORDER — PRENATAL VITAMINS 28-0.8 MG PO TABS: 1.0000 | ORAL_TABLET | Freq: Every day | ORAL | 12 refills | Status: DC

## 2020-06-08 MED ORDER — CEPHALEXIN 500 MG PO CAPS: 500.0000 mg | ORAL_CAPSULE | Freq: Four times a day (QID) | ORAL | 2 refills | Status: DC

## 2020-06-08 NOTE — Discharge Instructions (Signed)
Vaginal Bleeding During Pregnancy, Second Trimester A small amount of bleeding from the vagina is common during pregnancy. This kind of bleeding is also called spotting. Sometimes the bleeding is normal and is not a sign of problems. In some other cases, it is a sign of something serious. In the second trimester, normal bleeding can happen:  Because of changes in your blood vessels.  When you have sex.  When you have pelvic exams. In the second trimester, some abnormal things can cause bleeding. These include:  Infection or swelling.  Growths in the lowest part of the womb (cervix). These growths are also called polyps.  Problems of the placenta. The placenta can block the opening of the cervix. The placenta can also break away from the womb.  Miscarriage.  Early labor.  The cervix that opens early, before labor.  An egg that was not fertilized properly. This causes a type of pregnancy called molar pregnancy. Tell your doctor right away if there is any bleeding from your vagina. Follow these instructions at home: Watch your bleeding  Watch your condition for any changes. Let your doctor know if you are worried about something.  Try to know what causes your bleeding. Ask yourself these questions: ? Does the bleeding start on its own? ? Does the bleeding start after something is done, such as sex or a pelvic exam?  Use a diary to write down the things you see about your bleeding. Write in your diary: ? If the bleeding flows freely without stopping, or if it starts and stops, and then starts again. ? If the bleeding is heavy or light. ? How many pads you use in a day and how much blood is in them.  Tell your doctor if you pass tissue. He or she may want to see it.   Activity  Follow your doctor's instructions about limiting your activities. Ask what activities are safe for you.  Do not exercise or do activities that take a lot of effort until your doctor says that this is  safe.  Do not have sex until your doctor says that this is safe.  Do not lift anything that is heavier than 10 lb (4.5 kg), or the limit that you are told.  If needed, make plans for someone to help with your normal activities. Medicines  Take over-the-counter and prescription medicines only as told by your doctor.  Do not take aspirin. It can cause bleeding. General instructions  Do not use tampons.  Do not douche.  Keep all follow-up visits. Contact a doctor if:  You have bleeding in the vagina at any time during pregnancy.  You have cramps.  You have a fever that does not get better with medicine. Get help right away if:  You have very bad cramps in your back or belly (abdomen).  You have contractions.  You have chills.  Your bleeding gets worse.  You pass large clots or a lot of tissue from your vagina.  You feel light-headed.  You feel weak.  You faint.  You are leaking fluid from your vagina.  You have a gush of fluid from your vagina. Summary  A small amount of bleeding during pregnancy is normal. But bleeding can be a sign of something serious. Tell your doctor right away about any bleeding from your vagina.  Try to know what causes your bleeding. Does the bleeding occur on its own, or does it occur after something is done, such as sex or pelvic exams?  Follow your doctor's instructions about what activities you can do.  Keep all follow-up visits. This information is not intended to replace advice given to you by your health care provider. Make sure you discuss any questions you have with your health care provider. Document Revised: 11/25/2019 Document Reviewed: 11/25/2019 Elsevier Patient Education  2021 Elsevier Inc. Pregnancy and Urinary Tract Infection  A urinary tract infection (UTI) is an infection of any part of the urinary tract. This includes the kidneys, the tubes that connect your kidneys to your bladder (ureters), the bladder, and the  tube that carries urine out of your body (urethra). These organs make, store, and get rid of urine in the body. Your health care provider may use other names to describe the infection. An upper UTI affects the ureters and kidneys (pyelonephritis). A lower UTI affects the bladder (cystitis) and urethra (urethritis). Most urinary tract infections are caused by bacteria in your genital area, around the entrance to your urinary tract (urethra). These bacteria grow and cause irritation and inflammation of your urinary tract. You are more likely to develop a UTI during pregnancy because the physical and hormonal changes your body goes through can make it easier for bacteria to get into your urinary tract. Your growing baby also puts pressure on your bladder and can affect urine flow. It is important to recognize and treat UTIs in pregnancy because of the risk of serious complications for both you and your baby. How does this affect me? Symptoms of a UTI include:  Needing to urinate right away (urgently).  Frequent urination or passing small amounts of urine frequently.  Pain or burning with urination.  Blood in the urine.  Urine that smells bad or unusual.  Trouble urinating.  Cloudy urine.  Pain in the abdomen or lower back.  Vaginal discharge. You may also have:  Vomiting or a decreased appetite.  Confusion.  Irritability or tiredness.  A fever.  Diarrhea. How does this affect my baby? An untreated UTI during pregnancy could lead to a kidney infection or a systemic infection, which can cause health problems that could affect your baby. Possible complications of an untreated UTI include:  Giving birth to your baby before 37 weeks of pregnancy (premature).  Having a baby with a low birth weight.  Developing high blood pressure during pregnancy (preeclampsia).  Having a low hemoglobin level (anemia). What can I do to lower my risk? To prevent a UTI:  Go to the bathroom as soon  as you feel the need. Do not hold urine for long periods of time.  Always wipe from front to back, especially after a bowel movement. Use each tissue one time when you wipe.  Empty your bladder after sex.  Keep your genital area dry.  Drink 6-10 glasses of water each day.  Do not douche or use deodorant sprays. How is this treated? Treatment for this condition may include:  Antibiotic medicines that are safe to take during pregnancy.  Other medicines to treat less common causes of UTI. Follow these instructions at home:  If you were prescribed an antibiotic medicine, take it as told by your health care provider. Do not stop using the antibiotic even if you start to feel better.  Keep all follow-up visits as told by your health care provider. This is important. Contact a health care provider if:  Your symptoms do not improve or they get worse.  You have abnormal vaginal discharge. Get help right away if you:  Have a  fever.  Have nausea and vomiting.  Have back or side pain.  Feel contractions in your uterus.  Have lower belly pain.  Have a gush of fluid from your vagina.  Have blood in your urine. Summary  A urinary tract infection (UTI) is an infection of any part of the urinary tract, which includes the kidneys, ureters, bladder, and urethra.  Most urinary tract infections are caused by bacteria in your genital area, around the entrance to your urinary tract (urethra).  You are more likely to develop a UTI during pregnancy.  If you were prescribed an antibiotic medicine, take it as told by your health care provider. Do not stop using the antibiotic even if you start to feel better. This information is not intended to replace advice given to you by your health care provider. Make sure you discuss any questions you have with your health care provider. Document Revised: 06/26/2018 Document Reviewed: 02/05/2018 Elsevier Patient Education  2021 [employer].

## 2020-06-10 LAB — CULTURE, OB URINE: Culture: >=100000 — AB

## 2020-06-12 ENCOUNTER — Encounter: Payer: Medicaid Other | Admitting: Family Medicine

## 2020-07-27 ENCOUNTER — Encounter: Payer: Self-pay | Admitting: Certified Nurse Midwife

## 2020-07-27 ENCOUNTER — Ambulatory Visit (INDEPENDENT_AMBULATORY_CARE_PROVIDER_SITE_OTHER): Payer: Medicaid Other | Admitting: Certified Nurse Midwife

## 2020-07-27 ENCOUNTER — Other Ambulatory Visit: Payer: Self-pay

## 2020-07-27 VITALS — BP 107/65 | HR 92 | Ht 63.0 in | Wt 187.0 lb

## 2020-07-27 DIAGNOSIS — Z3A24 24 weeks gestation of pregnancy: Secondary | ICD-10-CM | POA: Diagnosis not present

## 2020-07-27 DIAGNOSIS — O2342 Unspecified infection of urinary tract in pregnancy, second trimester: Secondary | ICD-10-CM | POA: Diagnosis not present

## 2020-07-27 DIAGNOSIS — Z9151 Personal history of suicidal behavior: Secondary | ICD-10-CM

## 2020-07-27 DIAGNOSIS — D509 Iron deficiency anemia, unspecified: Secondary | ICD-10-CM

## 2020-07-27 DIAGNOSIS — Z3492 Encounter for supervision of normal pregnancy, unspecified, second trimester: Secondary | ICD-10-CM

## 2020-07-27 DIAGNOSIS — Z3490 Encounter for supervision of normal pregnancy, unspecified, unspecified trimester: Secondary | ICD-10-CM | POA: Insufficient documentation

## 2020-07-27 DIAGNOSIS — O99019 Anemia complicating pregnancy, unspecified trimester: Secondary | ICD-10-CM | POA: Diagnosis not present

## 2020-07-27 LAB — POCT URINALYSIS DIP (DEVICE)
Bilirubin Urine: NEGATIVE
Glucose, UA: NEGATIVE mg/dL
Hgb urine dipstick: NEGATIVE
Ketones, ur: NEGATIVE mg/dL
Leukocytes,Ua: NEGATIVE
Nitrite: POSITIVE — AB
Protein, ur: NEGATIVE mg/dL
Specific Gravity, Urine: 1.015 (ref 1.005–1.030)
Urobilinogen, UA: 4 mg/dL — ABNORMAL HIGH (ref 0.0–1.0)
pH: 7.5 (ref 5.0–8.0)

## 2020-07-27 MED ORDER — CEFADROXIL 500 MG PO CAPS: 500.0000 mg | ORAL_CAPSULE | Freq: Two times a day (BID) | ORAL | 0 refills | Status: DC

## 2020-07-27 MED ORDER — PRENATAL GUMMIES/DHA & FA 0.4-32.5 MG PO CHEW: 1.0000 | CHEWABLE_TABLET | Freq: Every day | ORAL | 5 refills | Status: DC

## 2020-07-27 MED ORDER — POLYSACCHARIDE IRON COMPLEX 150 MG PO CAPS: 150.0000 mg | ORAL_CAPSULE | Freq: Every day | ORAL | 3 refills | Status: DC

## 2020-07-27 NOTE — Progress Notes (Signed)
History:   Kristin Brock is a 25 y.o. [id number] at 79w0dby early ultrasound being seen today for her first obstetrical visit.  Her obstetrical history is significant for multiple social issues and [facility] Patient does intend to breast feed. Pregnancy history fully reviewed.  Patient reports no complaints. Had some nausea/vomiting in the beginning of pregnancy but that resolved. Was treated for a UTI but did not finish pills because she has a hard time remembering to take them more than twice daily. Did not know her bloodwork showed anemia or that iron had been prescribed.  Did not disclose to CMA or myself, but told Pregnancy Navigator that she was hospitalized this past year for a suicide attempt. Is having occasional ideation and battling some depression but has no intent or plan. Experiencing transportation issues and food insecurity.       HISTORY: OB History  Gravida Para Term Preterm AB Living  '4 2 2 ' 0 1 2  SAB IAB Ectopic Multiple Live Births  0 1 0 0 2    # Outcome Date GA Lbr Len/2nd Weight Sex Delivery Anes PTL Lv  4 Current           3 IAB 11/19/19          2 Term 04/24/19 [id number] / 00:06 7 lb 13 oz (3.545 kg) M Vag-Spont EPI  LIV     Birth Comments: WDL     Name: KEGrace Bushy   Apgar1: 8  Apgar5: 9  1 Term 03/22/14   7 lb (3.175 kg) F Vag-Spont   LIV     Birth Comments: fainting, asthma exacerbations    Pap smear completed 10/07/2018 and was normal.  Past Medical History:  Diagnosis Date  . Asthma    Past Surgical History:  Procedure Laterality Date  . INDUCED ABORTION    . NO PAST SURGERIES     Family History  Problem Relation Age of Onset  . Cancer Maternal Grandmother   . Pulmonary embolism Paternal Grandmother    Social History   Tobacco Use  . Smoking status: Current Every Day Smoker    Packs/day: 0.25    Types: Cigarettes  . Smokeless tobacco: Never Used  Vaping Use  . Vaping Use: Some days  Substance Use Topics  . Alcohol use: Yes     Comment: occasional  . Drug use: Not Currently   No Known Allergies Current Outpatient Medications on File Prior to Visit  Medication Sig Dispense Refill  . Blood Pressure Monitoring (BLOOD PRESSURE KIT) DEVI 1 Device by Does not apply route daily. ICD 10: Z34.00 1 Device 0   No current facility-administered medications on file prior to visit.    Review of Systems Pertinent items noted in HPI and remainder of comprehensive ROS otherwise negative. Physical Exam:   Vitals:   07/27/20 1524 07/27/20 1526  BP: 107/65   Pulse: 92   Weight: 187 lb (84.8 kg)   Height:  '5\' 3"'  (1.6 m)   Fetal Heart Rate (bpm): 135  Constitutional: Well-developed, well-nourished pregnant female in no acute distress.  HEENT: PERRLA Skin: normal color and turgor, no rash Cardiovascular: normal rate & rhythm, no murmur Respiratory: normal effort, lung sounds clear throughout GI: Abd soft, non-tender, pos BS x 4, gravid appropriate for gestational age MS: Extremities nontender, no edema, normal ROM Neurologic: Alert and oriented x 4.  GU: no CVA tenderness Pelvic exam deferred  Assessment:    Pregnancy: [id number] Active Problem  List   Diagnosis Date Noted  . Supervision of low-risk pregnancy 07/27/2020  . UTI (urinary tract infection) during pregnancy 10/11/2018     Plan:    1. Encounter for supervision of low-risk pregnancy in second trimester Initial labs drawn Prenatal vitamin gummies prescribed - [place] MFM OB FOLLOW UP; Future - Culture, OB Urine - CBC - ABO/Rh; Future - Antibody screen; Future - Hepatitis C antibody - Hepatitis B surface antigen - HIV Antibody (routine testing w rflx) - RPR - Rubella screen - Hemoglobin A1c - Genetic Screening - [facility] AMB BABYSCRIPTS OPT IN  2. [redacted] weeks gestation of pregnancy Routine new OB care - Problem list reviewed and updated. - Genetic Screening discussed, First trimester screen, Quad screen and NIPS: ordered. - Ultrasound discussed;  fetal anatomic survey: ordered. - Anticipatory guidance about prenatal visits given including labs, ultrasounds, and testing. - Discussed usage of Babyscripts and virtual visits as additional source of managing and completing prenatal visits in midst of coronavirus and pandemic.   - Encouraged to complete MyChart Registration for her ability to review results, send requests, and have questions addressed.  - The nature of [facility] with multiple MDs and Advanced Practice Providers was explained to patient; also emphasized that residents, students are part of our team. - Routine obstetric precautions reviewed. Encouraged to seek out care at office or emergency room [facility] MAU preferred) for urgent and/or emergent concerns.  3. Urinary tract infection in mother during second trimester of pregnancy - Strongly advised to take antibiotics as prescribed - cefadroxil (DURICEF) 500 MG capsule; Take 1 capsule (500 mg total) by mouth 2 (two) times daily.  Dispense: 14 capsule; Refill: 0  4. Iron deficiency anemia during pregnancy - iron polysaccharides (NIFEREX) 150 MG capsule; Take 1 capsule (150 mg total) by mouth daily.  Dispense: 30 capsule; Refill: 3  5. History of suicide attempt - Ambulatory referral to [place] [place]  Patient taken to food market by pregnancy navigator after enrollment in program.  Return in about 4 weeks (around 08/24/2020) for LOB w GTT.    Gaylan Gerold, MSN, CNM, [place] Certified Nurse Midwife, [facility]

## 2020-07-27 NOTE — Patient Instructions (Signed)
 Glucose Tolerance Test Why am I having this test? The glucose tolerance test (GTT) is done to check how your body processes sugar (glucose). This is one of several tests used to diagnose diabetes (diabetes mellitus). Your health care provider may recommend this test if you:  Have a family history of diabetes.  Are obese.  Have infections that keep coming back.  Have had a lot of wounds that did not heal quickly, especially on your legs and feet.  Are a woman and have a history of giving birth to very large babies or a history of repeated fetal loss (stillbirth).  Have had high glucose levels in your urine or blood: ? During a past pregnancy. ? After a heart attack, surgery, or prolonged periods of high stress. What is being tested? This test measures the amount of glucose in your blood at different times during a period of 2 hours. This indicates how well your body is able to process glucose. What kind of sample is taken? Blood samples are required for this test. They are usually collected by inserting a needle into a blood vessel.   How do I prepare for this test?  For 3 days before your test, eat normally. Have plenty of carbohydrate-rich foods.  Follow instructions from your health care provider about: ? Eating or drinking restrictions on the day of the test. You may be asked to not eat or drink anything other than water (fast) starting 8-12 hours before the test. ? Changing or stopping your regular medicines. Some medicines may interfere with this test. Tell a health care provider about:  All medicines you are taking, including vitamins, herbs, eye drops, creams, and over-the-counter medicines.  Any blood disorders you have.  Any surgeries you have had.  Any medical conditions you have.  Whether you are pregnant or may be pregnant. What happens during the test? First, your blood glucose will be measured. This is referred to as your fasting blood glucose, since you  fasted before the test. Then, you will drink a glucose solution that contains a specific amount of glucose. Your blood glucose will be measured again 1 and 2 hours after drinking the solution. This test takes 2 hours to complete. You will need to stay at the testing location during this time. During the testing period:  Do not eat or drink anything other than the glucose solution. You will be allowed to drink water.  Do not exercise.  Do not use any products that contain nicotine or tobacco. These products include cigarettes, chewing tobacco, and vaping devices, such as e-cigarettes. If you need help quitting, ask your health care provider. The testing procedure may vary among health care providers and hospitals. How are the results reported? Your test results will be reported as values. These will be given as milligrams of glucose per deciliter of blood (mg/dL) or millimoles per liter (mmol/L). Your health care provider will compare your results to normal ranges that were established after testing a large group of people (reference ranges). Reference ranges may vary among labs and hospitals. For this test, common reference ranges are:  Fasting: less than 110 mg/dL (6.1 mmol/L).  1 hour after drinking glucose: less than 180 mg/dL (10.0 mmol/L).  2 hours after drinking glucose: less than 140 mg/dL (7.8 mmol/L). What do the results mean? Results that are within the reference ranges are considered normal, meaning that your glucose levels are well controlled. Results higher than the reference ranges may mean that you recently experienced   stress, such as from an injury or a sudden (acute) condition like a heart attack or stroke, or that you have:  Diabetes.  Cushing syndrome.  Tumors such as pheochromocytoma or glucagonoma.  Kidney failure.  Pancreatitis.  Hyperthyroidism.  An infection. Talk with your health care provider about what your results mean. Questions to ask your health care  provider Ask your health care provider, or the department that is doing the test:  When will my results be ready?  How will I get my results?  What are my treatment options?  What other tests do I need?  What are my next steps? Summary  The GTT is done to check how your body processes glucose. This is one of several tests used to diagnose diabetes.  This test measures the amount of glucose in your blood at different times during a period of 2 hours. This indicates how well your body is able to process glucose.  Talk with your health care provider about what your results mean. This information is not intended to replace advice given to you by your health care provider. Make sure you discuss any questions you have with your health care provider. Document Revised: 12/01/2019 Document Reviewed: 12/01/2019 Elsevier Patient Education  2021 Elsevier Inc.  

## 2020-07-30 LAB — URINE CULTURE, OB REFLEX

## 2020-07-30 LAB — CULTURE, OB URINE

## 2020-08-01 ENCOUNTER — Encounter: Payer: Self-pay | Admitting: *Deleted

## 2020-08-01 LAB — CBC
Hematocrit: 27.2 % — ABNORMAL LOW (ref 34.0–46.6)
Hemoglobin: 9.1 g/dL — ABNORMAL LOW (ref 11.1–15.9)
MCH: 28.1 pg (ref 26.6–33.0)
MCHC: 33.5 g/dL (ref 31.5–35.7)
MCV: 84 fL (ref 79–97)
Platelets: 238 10*3/uL (ref 150–450)
RBC: 3.24 x10E6/uL — ABNORMAL LOW (ref 3.77–5.28)
RDW: 14.4 % (ref 11.7–15.4)
WBC: 13.4 10*3/uL — ABNORMAL HIGH (ref 3.4–10.8)

## 2020-08-01 LAB — RPR: RPR Ser Ql: NONREACTIVE

## 2020-08-01 LAB — RUBELLA SCREEN: Rubella Antibodies, IGG: 5.23 index (ref >0.99)

## 2020-08-01 LAB — HEPATITIS C ANTIBODY: Hep C Virus Ab: <0.1 s/co ratio (ref 0.0–0.9)

## 2020-08-01 LAB — HEMOGLOBIN A1C
Est. average glucose Bld gHb Est-mCnc: 108 mg/dL
Hgb A1c MFr Bld: 5.4 % (ref 4.8–5.6)

## 2020-08-01 LAB — HEPATITIS B SURFACE ANTIGEN: Hepatitis B Surface Ag: NEGATIVE

## 2020-08-01 LAB — HIV ANTIBODY (ROUTINE TESTING W REFLEX): HIV Screen 4th Generation wRfx: NONREACTIVE

## 2020-08-02 ENCOUNTER — Telehealth: Payer: Self-pay

## 2020-08-02 DIAGNOSIS — O99019 Anemia complicating pregnancy, unspecified trimester: Secondary | ICD-10-CM

## 2020-08-02 NOTE — Telephone Encounter (Addendum)
-----   Message from Bernerd Limbo, [place] sent at 08/02/2020  8:23 AM EDT ----- Can you please get this patient scheduled for a feraheme infusion? Orders are in and she was informed of results via MyChart.    Appt scheduled with Short Stay for 08/22/20 @ 1000. Called pt with appt information.

## 2020-08-02 NOTE — Addendum Note (Signed)
Addended by: Edd Arbour on: 08/02/2020 08:24 AM   Modules accepted: Orders

## 2020-08-07 ENCOUNTER — Ambulatory Visit: Payer: Self-pay

## 2020-08-07 ENCOUNTER — Ambulatory Visit: Payer: Medicaid Other

## 2020-08-09 ENCOUNTER — Encounter: Payer: Self-pay | Admitting: *Deleted

## 2020-08-21 NOTE — Discharge Instructions (Signed)
Ferumoxytol injection What is this medicine? FERUMOXYTOL is an iron complex. Iron is used to make healthy red blood cells, which carry oxygen and nutrients throughout the body. This medicine is used to treat iron deficiency anemia. This medicine may be used for other purposes; ask your health care provider or pharmacist if you have questions. COMMON BRAND NAME(S): Feraheme What should I tell my health care provider before I take this medicine? They need to know if you have any of these conditions:  anemia not caused by low iron levels  high levels of iron in the blood  magnetic resonance imaging (MRI) test scheduled  an unusual or allergic reaction to iron, other medicines, foods, dyes, or preservatives  pregnant or trying to get pregnant  breast-feeding How should I use this medicine? This medicine is for injection into a vein. It is given by a health care professional in a hospital or clinic setting. Talk to your pediatrician regarding the use of this medicine in children. Special care may be needed. Overdosage: If you think you have taken too much of this medicine contact a poison control center or emergency room at once. NOTE: This medicine is only for you. Do not share this medicine with others. What if I miss a dose? It is important not to miss your dose. Call your doctor or health care professional if you are unable to keep an appointment. What may interact with this medicine? This medicine may interact with the following medications:  other iron products This list may not describe all possible interactions. Give your health care provider a list of all the medicines, herbs, non-prescription drugs, or dietary supplements you use. Also tell them if you smoke, drink alcohol, or use illegal drugs. Some items may interact with your medicine. What should I watch for while using this medicine? Visit your doctor or healthcare professional regularly. Tell your doctor or healthcare  professional if your symptoms do not start to get better or if they get worse. You may need blood work done while you are taking this medicine. You may need to follow a special diet. Talk to your doctor. Foods that contain iron include: whole grains/cereals, dried fruits, beans, or peas, leafy green vegetables, and organ meats (liver, kidney). What side effects may I notice from receiving this medicine? Side effects that you should report to your doctor or health care professional as soon as possible:  allergic reactions like skin rash, itching or hives, swelling of the face, lips, or tongue  breathing problems  changes in blood pressure  feeling faint or lightheaded, falls  fever or chills  flushing, sweating, or hot feelings  swelling of the ankles or feet Side effects that usually do not require medical attention (report to your doctor or health care professional if they continue or are bothersome):  diarrhea  headache  nausea, vomiting  stomach pain This list may not describe all possible side effects. Call your doctor for medical advice about side effects. You may report side effects to FDA at 1-800-FDA-1088. Where should I keep my medicine? This drug is given in a hospital or clinic and will not be stored at home. NOTE: This sheet is a summary. It may not cover all possible information. If you have questions about this medicine, talk to your doctor, pharmacist, or health care provider.  2021 Elsevier/Gold Standard (2016-04-22 20:21:10)  

## 2020-08-22 ENCOUNTER — Other Ambulatory Visit: Payer: Self-pay

## 2020-08-22 ENCOUNTER — Ambulatory Visit (HOSPITAL_COMMUNITY)
Admission: RE | Admit: 2020-08-22 | Discharge: 2020-08-22 | Disposition: A | Discharge status: Home / Self Care | Payer: Medicaid Other | Source: Ambulatory Visit | Attending: Certified Nurse Midwife | Admitting: Certified Nurse Midwife

## 2020-08-22 DIAGNOSIS — D509 Iron deficiency anemia, unspecified: Secondary | ICD-10-CM | POA: Diagnosis present

## 2020-08-22 LAB — FERRITIN: Ferritin: 6 ng/mL — ABNORMAL LOW (ref 11–307)

## 2020-08-22 MED ORDER — FERUMOXYTOL INJECTION 510 MG/17 ML
510.0000 mg | INTRAVENOUS | Status: DC
  Administered 2020-08-22: 510 mg via INTRAVENOUS
  Filled 2020-08-22: qty 510

## 2020-08-24 ENCOUNTER — Encounter: Payer: Medicaid Other | Admitting: Certified Nurse Midwife

## 2020-08-28 NOTE — BH Specialist Note (Signed)
Pt did not arrive to video visit and did not answer the phone; Left HIPPA-compliant message to call back Miko Sirico from [facility] at  [phone] (Masae Lukacs's office).  ?; left MyChart message for patient.  ? ?

## 2020-08-29 ENCOUNTER — Encounter (HOSPITAL_COMMUNITY)
Admission: RE | Admit: 2020-08-29 | Discharge: 2020-08-29 | Disposition: A | Discharge status: Home / Self Care | Payer: Medicaid Other | Source: Ambulatory Visit | Attending: Certified Nurse Midwife | Admitting: Certified Nurse Midwife

## 2020-08-29 ENCOUNTER — Ambulatory Visit: Payer: Medicaid Other

## 2020-08-29 DIAGNOSIS — D508 Other iron deficiency anemias: Secondary | ICD-10-CM | POA: Diagnosis not present

## 2020-08-29 MED ORDER — SODIUM CHLORIDE 0.9 % IV SOLN
510.0000 mg | INTRAVENOUS | Status: DC
  Administered 2020-08-29: 510 mg via INTRAVENOUS
  Filled 2020-08-29: qty 510

## 2020-09-01 ENCOUNTER — Encounter: Payer: Self-pay | Admitting: *Deleted

## 2020-09-01 ENCOUNTER — Ambulatory Visit: Payer: Medicaid Other | Attending: Obstetrics and Gynecology | Admitting: *Deleted

## 2020-09-01 ENCOUNTER — Ambulatory Visit (HOSPITAL_BASED_OUTPATIENT_CLINIC_OR_DEPARTMENT_OTHER): Payer: Medicaid Other

## 2020-09-01 ENCOUNTER — Other Ambulatory Visit: Payer: Self-pay

## 2020-09-01 VITALS — BP 102/45 | HR 84

## 2020-09-01 DIAGNOSIS — Z3492 Encounter for supervision of normal pregnancy, unspecified, second trimester: Secondary | ICD-10-CM | POA: Diagnosis not present

## 2020-09-01 DIAGNOSIS — Z3687 Encounter for antenatal screening for uncertain dates: Secondary | ICD-10-CM | POA: Insufficient documentation

## 2020-09-01 DIAGNOSIS — Z3A29 29 weeks gestation of pregnancy: Secondary | ICD-10-CM | POA: Diagnosis not present

## 2020-09-01 DIAGNOSIS — O0933 Supervision of pregnancy with insufficient antenatal care, third trimester: Secondary | ICD-10-CM | POA: Diagnosis not present

## 2020-09-04 ENCOUNTER — Other Ambulatory Visit: Payer: Self-pay | Admitting: *Deleted

## 2020-09-04 ENCOUNTER — Ambulatory Visit: Payer: Medicaid Other | Admitting: Clinical

## 2020-09-04 DIAGNOSIS — Z91199 Patient's noncompliance with other medical treatment and regimen due to unspecified reason: Secondary | ICD-10-CM

## 2020-09-04 DIAGNOSIS — O0933 Supervision of pregnancy with insufficient antenatal care, third trimester: Secondary | ICD-10-CM

## 2020-09-29 ENCOUNTER — Ambulatory Visit: Payer: Self-pay

## 2020-09-29 ENCOUNTER — Ambulatory Visit: Payer: Medicaid Other

## 2020-10-26 ENCOUNTER — Ambulatory Visit: Payer: Medicaid Other

## 2020-10-31 ENCOUNTER — Ambulatory Visit: Payer: Medicaid Other | Admitting: *Deleted

## 2020-10-31 ENCOUNTER — Ambulatory Visit: Payer: Medicaid Other | Attending: Obstetrics

## 2020-10-31 ENCOUNTER — Other Ambulatory Visit: Payer: Self-pay

## 2020-10-31 VITALS — BP 115/62 | HR 90

## 2020-10-31 DIAGNOSIS — Z3A38 38 weeks gestation of pregnancy: Secondary | ICD-10-CM | POA: Diagnosis not present

## 2020-10-31 DIAGNOSIS — O0930 Supervision of pregnancy with insufficient antenatal care, unspecified trimester: Secondary | ICD-10-CM

## 2020-10-31 DIAGNOSIS — O0933 Supervision of pregnancy with insufficient antenatal care, third trimester: Secondary | ICD-10-CM | POA: Insufficient documentation

## 2020-10-31 NOTE — Progress Notes (Signed)
C/o "gush of clear fluid yesterday at 2:30pm, no leakage of fluid since." Did not notify OB MD or go to MAU to be assessed. Advised to notify OB MD and/or go to MAU if this occurs again.

## 2020-11-07 ENCOUNTER — Ambulatory Visit (INDEPENDENT_AMBULATORY_CARE_PROVIDER_SITE_OTHER): Payer: Medicaid Other | Admitting: Certified Nurse Midwife

## 2020-11-07 ENCOUNTER — Other Ambulatory Visit: Payer: Self-pay

## 2020-11-07 ENCOUNTER — Other Ambulatory Visit (HOSPITAL_COMMUNITY)
Admission: RE | Admit: 2020-11-07 | Discharge: 2020-11-07 | Disposition: A | Discharge status: Home / Self Care | Payer: Medicaid Other | Source: Ambulatory Visit | Attending: Certified Nurse Midwife | Admitting: Certified Nurse Midwife

## 2020-11-07 VITALS — BP 131/77 | HR 112 | Wt 192.4 lb

## 2020-11-07 DIAGNOSIS — Z3A38 38 weeks gestation of pregnancy: Secondary | ICD-10-CM | POA: Insufficient documentation

## 2020-11-07 DIAGNOSIS — Z3493 Encounter for supervision of normal pregnancy, unspecified, third trimester: Secondary | ICD-10-CM | POA: Insufficient documentation

## 2020-11-07 LAB — HEMOGLOBIN A1C
Est. average glucose Bld gHb Est-mCnc: 103 mg/dL
Hgb A1c MFr Bld: 5.2 % (ref 4.8–5.6)

## 2020-11-08 LAB — GC/CHLAMYDIA PROBE AMP (~~LOC~~) NOT AT ARMC
Chlamydia: NEGATIVE
Comment: NEGATIVE
Comment: NORMAL
Neisseria Gonorrhea: NEGATIVE

## 2020-11-08 LAB — GLUCOSE TOLERANCE, 1 HOUR: Glucose, 1Hr PP: 140 mg/dL (ref 65–199)

## 2020-11-08 NOTE — Progress Notes (Signed)
   PRENATAL VISIT NOTE  Subjective:  Kristin Brock is a 25 y.o. [id number] at [redacted]w[redacted]d being seen today for ongoing prenatal care.  She is currently monitored for the following issues for this low-risk pregnancy and has UTI (urinary tract infection) during pregnancy and Supervision of low-risk pregnancy on their problem list.  Patient reports no complaints. Has had a hard time getting to appointments due to childcare and transportation constraints. Her older child is back in school which makes it easier to attend appointments. Contractions: Irritability. Vag. Bleeding: None.  Movement: Present. Denies leaking of fluid.   The following portions of the patient's history were reviewed and updated as appropriate: allergies, current medications, past family history, past medical history, past social history, past surgical history and problem list.   Objective:   Vitals:   11/07/20 0919  BP: 131/77  Pulse: (!) 112  Weight: 192 lb 6.4 oz (87.3 kg)   Fetal Status: Fetal Heart Rate (bpm): 138 Fundal Height: 38 cm Movement: Present     General:  Alert, oriented and cooperative. Patient is in no acute distress.  Skin: Skin is warm and dry. No rash noted.   Cardiovascular: Normal heart rate noted  Respiratory: Normal respiratory effort, no problems with respiration noted  Abdomen: Soft, gravid, appropriate for gestational age.  Pain/Pressure: Present     Pelvic: Cervical exam deferred        Extremities: Normal range of motion.  Edema: Trace  Mental Status: Normal mood and affect. Normal behavior. Normal judgment and thought content.   Assessment and Plan:  Pregnancy: [id number] at [redacted]w[redacted]d 1. Encounter for supervision of low-risk pregnancy in third trimester - Doing well overall, feeling regular and vigorous fetal movement - Glucose Tolerance, 1 Hour - Hemoglobin A1c  2. [redacted] weeks gestation of pregnancy - Routine OB care - reviewed early labor symptoms - GC/Chlamydia probe amp ([place])not at  [facility] - Culture, beta strep (group b only)  Term labor symptoms and general obstetric precautions including but not limited to vaginal bleeding, contractions, leaking of fluid and fetal movement were reviewed in detail with the patient. Please refer to After Visit Summary for other counseling recommendations.   Return in about 1 week (around 11/14/2020) for IN-PERSON, LOB.  Future Appointments  Date Time Provider Department Center  11/15/2020  9:35 AM Bernerd Limbo, CNM [facility]    Bernerd Limbo, CNM

## 2020-11-11 LAB — CULTURE, BETA STREP (GROUP B ONLY): Strep Gp B Culture: NEGATIVE

## 2020-11-12 ENCOUNTER — Inpatient Hospital Stay (HOSPITAL_COMMUNITY)
Admission: AD | Admit: 2020-11-12 | Discharge: 2020-11-13 | DRG: 805 | Disposition: A | Discharge status: Home / Self Care | Payer: Medicaid Other | Attending: Obstetrics & Gynecology | Admitting: Obstetrics & Gynecology

## 2020-11-12 ENCOUNTER — Inpatient Hospital Stay (HOSPITAL_COMMUNITY): Payer: Medicaid Other | Admitting: Anesthesiology

## 2020-11-12 ENCOUNTER — Other Ambulatory Visit: Payer: Self-pay

## 2020-11-12 ENCOUNTER — Encounter (HOSPITAL_COMMUNITY): Payer: Self-pay | Admitting: Obstetrics & Gynecology

## 2020-11-12 DIAGNOSIS — O9852 Other viral diseases complicating childbirth: Secondary | ICD-10-CM | POA: Diagnosis present

## 2020-11-12 DIAGNOSIS — O26893 Other specified pregnancy related conditions, third trimester: Secondary | ICD-10-CM | POA: Diagnosis present

## 2020-11-12 DIAGNOSIS — U071 COVID-19: Secondary | ICD-10-CM | POA: Diagnosis present

## 2020-11-12 DIAGNOSIS — O99334 Smoking (tobacco) complicating childbirth: Principal | ICD-10-CM | POA: Diagnosis present

## 2020-11-12 DIAGNOSIS — F17210 Nicotine dependence, cigarettes, uncomplicated: Secondary | ICD-10-CM | POA: Diagnosis present

## 2020-11-12 DIAGNOSIS — Z3A39 39 weeks gestation of pregnancy: Secondary | ICD-10-CM

## 2020-11-12 HISTORY — DX: Depression, unspecified: F32.A

## 2020-11-12 HISTORY — DX: Urinary tract infection, site not specified: N39.0

## 2020-11-12 HISTORY — DX: Gonococcal infection, unspecified: A54.9

## 2020-11-12 HISTORY — DX: Anxiety disorder, unspecified: F41.9

## 2020-11-12 HISTORY — DX: Chlamydial infection, unspecified: A74.9

## 2020-11-12 HISTORY — DX: Anemia, unspecified: D64.9

## 2020-11-12 HISTORY — DX: Headache, unspecified: R51.9

## 2020-11-12 HISTORY — DX: Trichomoniasis, unspecified: A59.9

## 2020-11-12 LAB — RPR: RPR Ser Ql: NONREACTIVE

## 2020-11-12 LAB — CBC
HCT: 31.2 % — ABNORMAL LOW (ref 36.0–46.0)
Hemoglobin: 10.6 g/dL — ABNORMAL LOW (ref 12.0–15.0)
MCH: 31.8 pg (ref 26.0–34.0)
MCHC: 34 g/dL (ref 30.0–36.0)
MCV: 93.7 fL (ref 80.0–100.0)
Platelets: 183 10*3/uL (ref 150–400)
RBC: 3.33 MIL/uL — ABNORMAL LOW (ref 3.87–5.11)
RDW: 17.2 % — ABNORMAL HIGH (ref 11.5–15.5)
WBC: 9.8 10*3/uL (ref 4.0–10.5)
nRBC: 0 % (ref 0.0–0.2)

## 2020-11-12 LAB — RESP PANEL BY RT-PCR (FLU A&B, COVID) ARPGX2
Influenza A by PCR: NEGATIVE
Influenza B by PCR: NEGATIVE
SARS Coronavirus 2 by RT PCR: POSITIVE — AB

## 2020-11-12 MED ORDER — OXYTOCIN BOLUS FROM INFUSION
333.0000 mL | Freq: Once | INTRAVENOUS | Status: AC
  Administered 2020-11-12: 333 mL via INTRAVENOUS

## 2020-11-12 MED ORDER — ONDANSETRON HCL 4 MG/2ML IJ SOLN: 4.0000 mg | INTRAMUSCULAR | Status: DC | PRN

## 2020-11-12 MED ORDER — FENTANYL CITRATE (PF) 100 MCG/2ML IJ SOLN
50.0000 ug | INTRAMUSCULAR | Status: DC | PRN
  Administered 2020-11-12: 100 ug via INTRAVENOUS
  Filled 2020-11-12: qty 2

## 2020-11-12 MED ORDER — EPHEDRINE 5 MG/ML INJ: 10.0000 mg | INTRAVENOUS | Status: DC | PRN

## 2020-11-12 MED ORDER — DIBUCAINE (PERIANAL) 1 % EX OINT: 1.0000 "application " | TOPICAL_OINTMENT | CUTANEOUS | Status: DC | PRN

## 2020-11-12 MED ORDER — BENZOCAINE-MENTHOL 20-0.5 % EX AERO
1.0000 "application " | INHALATION_SPRAY | CUTANEOUS | Status: DC | PRN
  Administered 2020-11-13: 1 via TOPICAL
  Filled 2020-11-12: qty 56

## 2020-11-12 MED ORDER — ONDANSETRON HCL 4 MG PO TABS: 4.0000 mg | ORAL_TABLET | ORAL | Status: DC | PRN

## 2020-11-12 MED ORDER — SOD CITRATE-CITRIC ACID 500-334 MG/5ML PO SOLN: 30.0000 mL | ORAL | Status: DC | PRN

## 2020-11-12 MED ORDER — TETANUS-DIPHTH-ACELL PERTUSSIS 5-2.5-18.5 LF-MCG/0.5 IM SUSY: 0.5000 mL | PREFILLED_SYRINGE | Freq: Once | INTRAMUSCULAR | Status: DC

## 2020-11-12 MED ORDER — LACTATED RINGERS IV SOLN: 500.0000 mL | Freq: Once | INTRAVENOUS | Status: DC

## 2020-11-12 MED ORDER — LACTATED RINGERS IV SOLN: 500.0000 mL | INTRAVENOUS | Status: DC | PRN

## 2020-11-12 MED ORDER — FENTANYL-BUPIVACAINE-NACL 0.5-0.125-0.9 MG/250ML-% EP SOLN
12.0000 mL/h | EPIDURAL | Status: DC | PRN
  Administered 2020-11-12: 12 mL/h via EPIDURAL
  Filled 2020-11-12: qty 250

## 2020-11-12 MED ORDER — WITCH HAZEL-GLYCERIN EX PADS: 1.0000 "application " | MEDICATED_PAD | CUTANEOUS | Status: DC | PRN

## 2020-11-12 MED ORDER — PHENYLEPHRINE 40 MCG/ML (10ML) SYRINGE FOR IV PUSH (FOR BLOOD PRESSURE SUPPORT): 80.0000 ug | PREFILLED_SYRINGE | INTRAVENOUS | Status: DC | PRN

## 2020-11-12 MED ORDER — SENNOSIDES-DOCUSATE SODIUM 8.6-50 MG PO TABS
2.0000 | ORAL_TABLET | ORAL | Status: DC
  Administered 2020-11-12: 2 via ORAL
  Filled 2020-11-12: qty 2

## 2020-11-12 MED ORDER — LIDOCAINE HCL (PF) 1 % IJ SOLN
INTRAMUSCULAR | Status: DC | PRN
  Administered 2020-11-12: 11 mL via EPIDURAL

## 2020-11-12 MED ORDER — OXYCODONE-ACETAMINOPHEN 5-325 MG PO TABS: 2.0000 | ORAL_TABLET | ORAL | Status: DC | PRN

## 2020-11-12 MED ORDER — DIPHENHYDRAMINE HCL 25 MG PO CAPS: 25.0000 mg | ORAL_CAPSULE | Freq: Four times a day (QID) | ORAL | Status: DC | PRN

## 2020-11-12 MED ORDER — ONDANSETRON HCL 4 MG/2ML IJ SOLN: 4.0000 mg | Freq: Four times a day (QID) | INTRAMUSCULAR | Status: DC | PRN

## 2020-11-12 MED ORDER — PRENATAL MULTIVITAMIN CH
1.0000 | ORAL_TABLET | Freq: Every day | ORAL | Status: DC
  Administered 2020-11-13: 1 via ORAL
  Filled 2020-11-12: qty 1

## 2020-11-12 MED ORDER — IBUPROFEN 600 MG PO TABS
600.0000 mg | ORAL_TABLET | Freq: Four times a day (QID) | ORAL | Status: DC
  Administered 2020-11-12 – 2020-11-13 (×4): 600 mg via ORAL
  Filled 2020-11-12 (×4): qty 1

## 2020-11-12 MED ORDER — COCONUT OIL OIL: 1.0000 "application " | TOPICAL_OIL | Status: DC | PRN

## 2020-11-12 MED ORDER — SIMETHICONE 80 MG PO CHEW: 80.0000 mg | CHEWABLE_TABLET | ORAL | Status: DC | PRN

## 2020-11-12 MED ORDER — ACETAMINOPHEN 325 MG PO TABS: 650.0000 mg | ORAL_TABLET | ORAL | Status: DC | PRN

## 2020-11-12 MED ORDER — DIPHENHYDRAMINE HCL 50 MG/ML IJ SOLN: 12.5000 mg | INTRAMUSCULAR | Status: DC | PRN

## 2020-11-12 MED ORDER — OXYCODONE-ACETAMINOPHEN 5-325 MG PO TABS: 1.0000 | ORAL_TABLET | ORAL | Status: DC | PRN

## 2020-11-12 MED ORDER — LACTATED RINGERS IV SOLN: INTRAVENOUS | Status: DC

## 2020-11-12 MED ORDER — OXYTOCIN-SODIUM CHLORIDE 30-0.9 UT/500ML-% IV SOLN
2.5000 [IU]/h | INTRAVENOUS | Status: DC
  Filled 2020-11-12: qty 500

## 2020-11-12 MED ORDER — ACETAMINOPHEN 325 MG PO TABS
650.0000 mg | ORAL_TABLET | ORAL | Status: DC | PRN
  Administered 2020-11-13: 650 mg via ORAL
  Filled 2020-11-12: qty 2

## 2020-11-12 MED ORDER — ZOLPIDEM TARTRATE 5 MG PO TABS: 5.0000 mg | ORAL_TABLET | Freq: Every evening | ORAL | Status: DC | PRN

## 2020-11-12 MED ORDER — LIDOCAINE HCL (PF) 1 % IJ SOLN: 30.0000 mL | INTRAMUSCULAR | Status: DC | PRN

## 2020-11-12 NOTE — H&P (Addendum)
OBSTETRIC ADMISSION HISTORY AND PHYSICAL  Kristin Brock is a 25 y.o. female [phone] with IUP at 3w3dby midtrimester [place] for SOL. She reports +FMs, No LOF, no VB, no blurry vision, headaches or peripheral edema, and RUQ pain.  She plans on bottle feeding. She requests depo for birth control. [facility] By midtrimester [place]--->  Estimated Date of Delivery: 11/16/20  Sono:    '@[redacted]w[redacted]d' , CWD, normal anatomy, cephalic presentation, posterior placental lie, 194g, 43% EFW  Prenatal History/Complications:  Late and insufficient PNC: started in 2nd trimester and seen twice in pregnancy  Past Medical History: Past Medical History:  Diagnosis Date   Anemia    Anxiety    Asthma    Chlamydia    Depression    Gonorrhea    Headache    Trichomonas infection    UTI (urinary tract infection)     Past Surgical History: Past Surgical History:  Procedure Laterality Date   INDUCED ABORTION     NO PAST SURGERIES      Obstetrical History: OB History     Gravida  4   Para  2   Term  2   Preterm  0   AB  1   Living  2      SAB  0   IAB  1   Ectopic  0   Multiple  0   Live Births  2           Social History Social History   Socioeconomic History   Marital status: Single    Spouse name: Not on file   Number of children: Not on file   Years of education: Not on file   Highest education level: Not on file  Occupational History   Not on file  Tobacco Use   Smoking status: Every Day    Packs/day: 0.25    Years: 10.00    Pack years: 2.50    Types: Cigarettes   Smokeless tobacco: Never  Vaping Use   Vaping Use: Never used  Substance and Sexual Activity   Alcohol use: Not Currently    Comment: occasional   Drug use: Never   Sexual activity: Yes  Other Topics Concern   Not on file  Social History Narrative   Not on file   Social Determinants of Health   Financial Resource Strain: Not on file  Food  Insecurity: Food Insecurity Present   Worried About Running Out of Food in the Last Year: Sometimes true   Ran Out of Food in the Last Year: Sometimes true  Transportation Needs: No Transportation Needs   Lack of Transportation (Medical): No   Lack of Transportation (Non-Medical): No  Physical Activity: Not on file  Stress: Not on file  Social Connections: Not on file    Family History: Family History  Problem Relation Age of Onset   Healthy Mother    Healthy Father    Cancer Maternal Grandmother    Pulmonary embolism Paternal Grandmother     Allergies: No Known Allergies  Medications Prior to Admission  Medication Sig Dispense Refill Last Dose   Blood Pressure Monitoring (BLOOD PRESSURE KIT) DEVI 1 Device by Does not apply route daily. ICD 10: Z34.00 1 Device 0    cefadroxil (DURICEF) 500 MG capsule Take 1 capsule (500 mg total) by mouth 2 (two) times daily. (Patient not taking: No sig reported) 14 capsule 0    iron polysaccharides (NIFEREX)  150 MG capsule Take 1 capsule (150 mg total) by mouth daily. (Patient not taking: No sig reported) 30 capsule 3    Prenatal MV-Min-FA-Omega-3 (PRENATAL GUMMIES/DHA & FA) 0.4-32.5 MG CHEW Chew 1 tablet by mouth daily with breakfast. (Patient not taking: No sig reported) 30 tablet 5      Review of Systems   All systems reviewed and negative except as stated in HPI  Blood pressure 118/71, pulse 89, temperature (!) 97.5 F (36.4 C), temperature source Oral, resp. rate 18, last menstrual period 02/10/2020, SpO2 99 %, unknown if currently breastfeeding. General appearance: alert, cooperative, appears stated age, and no distress Lungs: clear to auscultation bilaterally Heart: regular rate and rhythm Abdomen: soft, non-tender; bowel sounds normal Pelvic: normal external female genitalia per RN Extremities: Homans sign is negative, no sign of DVT DTR's 2+ Presentation: cephalic Fetal monitoringBaseline: 120 bpm, Variability: Good {> 6 bpm),  Accelerations: Reactive, and Decelerations: Absent Uterine activityFrequency: Every 2-4 minutes Dilation: 5 Effacement (%): 80 Station: Ballotable Exam by:: jolynn   Prenatal labs: ABO, Rh: --/--/PENDING (08/28 0830) Antibody: PENDING (08/28 0830) Rubella: 5.23 (05/12 1638) RPR: Non Reactive (05/12 1638)  HBsAg: Negative (05/12 1638)  HIV: Non Reactive (05/12 1638)  GBS: Negative/-- (08/23 1025)  1 hr Glucola 140 Genetic screening  LR NIPS Anatomy US WNL  Prenatal Transfer Tool  Maternal Diabetes: No Genetic Screening: Normal Maternal Ultrasounds/Referrals: Normal Fetal Ultrasounds or other Referrals:  None Maternal Substance Abuse:  No Significant Maternal Medications:  None Significant Maternal Lab Results: Group B Strep negative  Results for orders placed or performed during the hospital encounter of 11/12/20 (from the past 24 hour(s))  Type and screen [place]   Collection Time: 11/12/20  8:30 AM  Result Value Ref Range   ABO/RH(D) PENDING    Antibody Screen PENDING    Sample Expiration      11/15/2020,2359 Performed at [facility], [place]. [address], [place], [place] [address]     Patient Active Problem List   Diagnosis Date Noted   Indication for care in labor and delivery, antepartum 11/12/2020   Supervision of low-risk pregnancy 07/27/2020   UTI (urinary tract infection) during pregnancy 10/11/2018    Assessment/Plan:  Kristin Brock is a 25 y.o. [id number] at 9w3dhere for SOL.   #Labor: Patient presented to MAU with ctx and dilated to 5 cm. Will continue expectant mgmt for now, can add pitocin if ctx space out.  #Insufficient and late PNC: patient had difficulties arranging transportation and child care for older child. Will have SW see pp.  #H/o EtOH use in prior pregnancy/Mental health and IPV concerns: Concern for h/o depression and EtOH use in prior pregnancy, not noted during this pregnancy. Also concern per RN that patient did  not clearly answer yes or now when asked about IPV. So far observed behavior between patient and FOB in room has been appropriate. Will have TOC consult pp and continue to monitor.  #Pain: epidural #FWB: Cat I #ID:  GBS neg #MOF: bottle #MOC: depo #Circ:  Yes but outpatient  CGladys Damme MD  11/12/2020, 8:46 AM    Attestation of Supervision of Student:  I confirm that I have verified the information documented in the  resident's   note and that I have also personally reperformed the history, physical exam and all medical decision making activities.  I have verified that all services and findings are accurately documented in this student's note; and I agree with management and  plan as outlined in the documentation. I have also made any necessary editorial changes.  Marcille Buffy DNP, CNM  11/12/20  12:12 PM

## 2020-11-12 NOTE — Plan of Care (Signed)
Received call from lab at 1005 to notify pt of covid + status; instructed pt on how this diagnosis will effect hospital stay for labor and delivery with pt verbalizing understanding, encouraged pt and FOB to wear masks until discharge

## 2020-11-12 NOTE — MAU Note (Signed)
Brought back from front in wc.  C/o ctx's and vag bleeding. Started at 0430. No LOF.  +FM.  3rd baby . No problems with preg.

## 2020-11-12 NOTE — Progress Notes (Signed)
Kristin Brock is a 25 y.o. [id number] at [redacted]w[redacted]d admitted for active labor  Subjective: Patient very comfortable with epidural. FOB went to run errand.  Objective: BP (!) 104/57   Pulse 68   Temp 98.1 F (36.7 C) (Oral)   Resp 18   LMP 02/10/2020   SpO2 99%  No intake/output data recorded. No intake/output data recorded.  FHT:  FHR: 125 bpm, variability: moderate,  accelerations:  Present,  decelerations:  Absent UC:   regular, every 2-3 minutes SVE:  8-9/100/+1 with bulging bag Dilation: 5 Effacement (%): 80 Station: Ballotable Exam by:: jolynn  Labs: Lab Results  Component Value Date   WBC 9.8 11/12/2020   HGB 10.6 (L) 11/12/2020   HCT 31.2 (L) 11/12/2020   MCV 93.7 11/12/2020   PLT 183 11/12/2020    Assessment / Plan: Spontaneous labor, progressing normally  Labor:  Patient very close to complete, suspect with AROM she will deliver soon. Will wait for FOB to return prior to AROM. Preeclampsia:  n/a Fetal Wellbeing:  Cat I Pain Control:  Epidural I/D:   GBS neg Anticipated MOD:  NSVD  #COVID-19 positive: asymptomatic  Shirlean Mylar 11/12/2020, 2:00 PM

## 2020-11-12 NOTE — Discharge Summary (Addendum)
Postpartum Discharge Summary     Patient Name: Kristin Brock DOB: 20-Mar-1995 MRN: [id number]  Date of admission: 11/12/2020 Delivery date:11/12/2020  Delivering provider: Gladys Damme  Date of discharge: 11/13/2020  Admitting diagnosis: Indication for care in labor and delivery, antepartum [O75.9] Intrauterine pregnancy: [redacted]w[redacted]d    Secondary diagnosis:  Active Problems:   Indication for care in labor and delivery, antepartum  Additional problems: none    Discharge diagnosis: Term Pregnancy Delivered                                              Post partum procedures: none Augmentation: AROM and Pitocin Complications: None  Hospital course: Onset of Labor With Vaginal Delivery      25y.o. yo [id number] 366w3das admitted in Active Labor on 11/12/2020. Patient had an uncomplicated labor course as follows:  Membrane Rupture Time/Date: 2:49 PM ,11/12/2020   Delivery Method:Vaginal, Spontaneous  Episiotomy: None  Lacerations:  Labial  Patient had an uncomplicated postpartum course.  She is ambulating, tolerating a regular diet, passing flatus, and urinating well. Patient is discharged home in stable condition on 11/13/20.  Newborn Data: Birth date:11/12/2020  Birth time:3:09 PM  Gender:Female  Living status:Living  Apgars:9 ,9  Weight:3535 g   Magnesium Sulfate received: No BMZ received: No Rhophylac:N/A MMR:N/A T-DaP: 02/26/2020 Flu: No Transfusion:No  Physical exam  Vitals:   11/12/20 1855 11/12/20 2028 11/13/20 0155 11/13/20 0535  BP: 108/66 114/61 99/84 110/66  Pulse: 76 65 70 62  Resp: '18 16 18 18  ' Temp: 98.3 F (36.8 C) 98.6 F (37 C) 97.9 F (36.6 C) 97.6 F (36.4 C)  TempSrc: Axillary Oral Oral Oral  SpO2: 100% 100% 99% 100%   General: alert, cooperative, and no distress Lochia: appropriate Uterine Fundus: firm Incision: N/A DVT Evaluation: No evidence of DVT seen on physical exam. Negative Homan's sign. No cords or calf tenderness. No significant  calf/ankle edema. Labs: Lab Results  Component Value Date   WBC 9.8 11/12/2020   HGB 10.6 (L) 11/12/2020   HCT 31.2 (L) 11/12/2020   MCV 93.7 11/12/2020   PLT 183 11/12/2020   CMP Latest Ref Rng & Units 02/26/2020  Glucose 70 - 99 mg/dL 108(H)  BUN 6 - 20 mg/dL 4(L)  Creatinine 0.44 - 1.00 mg/dL 1.00  Sodium 135 - 145 mmol/L 143  Potassium 3.5 - 5.1 mmol/L 4.0  Chloride 98 - 111 mmol/L 109  CO2 22 - 32 mmol/L -  Calcium 8.9 - 10.3 mg/dL -  Total Protein 6.5 - 8.1 g/dL -  Total Bilirubin 0.3 - 1.2 mg/dL -  Alkaline Phos 38 - 126 U/L -  AST 15 - 41 U/L -  ALT 0 - 44 U/L -   Edinburgh Score: Edinburgh Postnatal Depression Scale Screening Tool 11/13/2020  I have been able to laugh and see the funny side of things. 1  I have looked forward with enjoyment to things. 1  I have blamed myself unnecessarily when things went wrong. 2  I have been anxious or worried for no good reason. 2  I have felt scared or panicky for no good reason. 2  Things have been getting on top of me. 2  I have been so unhappy that I have had difficulty sleeping. 2  I have felt sad or miserable. 1  I have been so unhappy  that I have been crying. 1  The thought of harming myself has occurred to me. 0  Edinburgh Postnatal Depression Scale Total 14     After visit meds:  Allergies as of 11/13/2020   No Known Allergies      Medication List     STOP taking these medications    cefadroxil 500 MG capsule Commonly known as: DURICEF   iron polysaccharides 150 MG capsule Commonly known as: NIFEREX       TAKE these medications    Blood Pressure Kit Devi 1 Device by Does not apply route daily. ICD 10: Z34.00   ibuprofen 600 MG tablet Commonly known as: ADVIL Take 1 tablet (600 mg total) by mouth every 6 (six) hours.   Prenatal Gummies/DHA & FA 0.4-32.5 MG Chew Chew 1 tablet by mouth daily with breakfast.         Discharge home in stable condition Infant Feeding: Bottle Infant  Disposition:home with mother Discharge instruction: per After Visit Summary and Postpartum booklet. Activity: Advance as tolerated. Pelvic rest for 6 weeks.  Diet: routine diet Future Appointments: Future Appointments  Date Time Provider [place]  11/15/2020  9:35 AM Gabriel Carina, CNM [facility]   Follow up Visit:   Please schedule this patient for a In person postpartum visit in 4 weeks with the following provider: Any provider. Additional Postpartum F/U: None   Low risk pregnancy complicated by:  limited prenatal care Delivery mode:  Vaginal, Spontaneous  Anticipated Birth Control:  Unsure  #H/o MDD: seen by LCSW, no barriers. Given resources.   11/13/2020 Gladys Damme, MD

## 2020-11-12 NOTE — Anesthesia Preprocedure Evaluation (Signed)
Anesthesia Evaluation  Patient identified by MRN, date of birth, ID band Patient awake    Reviewed: Allergy & Precautions, H&P , NPO status , Patient's Chart, lab work & pertinent test results, reviewed documented beta blocker date and time   Airway Mallampati: I  TM Distance: >3 FB Neck ROM: full    Dental no notable dental hx. (+) Teeth Intact, Dental Advisory Given   Pulmonary neg pulmonary ROS, Current Smoker and Patient abstained from smoking.,    Pulmonary exam normal breath sounds clear to auscultation       Cardiovascular negative cardio ROS Normal cardiovascular exam Rhythm:regular Rate:Normal     Neuro/Psych negative neurological ROS  negative psych ROS   GI/Hepatic negative GI ROS, Neg liver ROS,   Endo/Other  negative endocrine ROS  Renal/GU negative Renal ROS  negative genitourinary   Musculoskeletal   Abdominal   Peds  Hematology negative hematology ROS (+)   Anesthesia Other Findings   Reproductive/Obstetrics (+) Pregnancy                             Anesthesia Physical  Anesthesia Plan  ASA: II  Anesthesia Plan: Epidural   Post-op Pain Management:    Induction:   PONV Risk Score and Plan:   Airway Management Planned:   Additional Equipment:   Intra-op Plan:   Post-operative Plan:   Informed Consent: I have reviewed the patients History and Physical, chart, labs and discussed the procedure including the risks, benefits and alternatives for the proposed anesthesia with the patient or authorized representative who has indicated his/her understanding and acceptance.       Plan Discussed with: Anesthesiologist  Anesthesia Plan Comments:         Anesthesia Quick Evaluation

## 2020-11-12 NOTE — Anesthesia Procedure Notes (Signed)
Epidural Patient location during procedure: OB Start time: 11/12/2020 9:28 AM End time: 11/12/2020 9:44 AM  Staffing Anesthesiologist: Lowella Curb, MD Performed: anesthesiologist   Preanesthetic Checklist Completed: patient identified, IV checked, site marked, risks and benefits discussed, surgical consent, monitors and equipment checked, pre-op evaluation and timeout performed  Epidural Patient position: sitting Prep: ChloraPrep Patient monitoring: heart rate, cardiac monitor, continuous pulse ox and blood pressure Approach: midline Location: L2-L3 Injection technique: LOR saline  Needle:  Needle type: Tuohy  Needle gauge: 17 G Needle length: 9 cm Needle insertion depth: 6 cm Catheter type: closed end flexible Catheter size: 20 Guage Catheter at skin depth: 10 cm Test dose: negative  Assessment Events: blood not aspirated, injection not painful, no injection resistance, no paresthesia and negative IV test  Additional Notes Reason for block:procedure for pain

## 2020-11-13 ENCOUNTER — Other Ambulatory Visit (HOSPITAL_COMMUNITY): Payer: Self-pay

## 2020-11-13 MED ORDER — IBUPROFEN 600 MG PO TABS
600.0000 mg | ORAL_TABLET | Freq: Four times a day (QID) | ORAL | 0 refills | Status: DC
  Filled 2020-11-13: qty 30, 8d supply, fill #0

## 2020-11-13 NOTE — Clinical Social Work Maternal (Signed)
CLINICAL SOCIAL WORK MATERNAL/CHILD NOTE  Patient Details  Name: Lorenso Quarry MRN: [id number] Date of Birth: 11/09/1995  Date:  10-14-20  Clinical Social Worker Initiating Note:  Manfred Arch, [place] Date/Time: Initiated:  11/13/20/0930     Child's Name:  Undecided   Biological Parents:  Mother, Father Andrell Bergeson 10/21/1969)   Need for Interpreter:  None   Reason for Referral:  Late or No Prenatal Care  , Behavioral Health Concerns, Other (Comment) Inocente Salles 14)   Address:  [address] [place] [address]    Phone number:  [phone] (home)     Additional phone number:   Household Members/Support Persons (HM/SP):   Household Member/Support Person 1, Household Member/Support Person 2, Household Member/Support Person 3   HM/SP Name Relationship DOB or Age  HM/SP -1 Marlon Lesh Significant Other 10/21/1969  HM/SP -2 Marshon Denny Son 04/24/2019  HM/SP -3 Kemariah Ontko Daughter 03/22/2014  HM/SP -4        HM/SP -5        HM/SP -6        HM/SP -7        HM/SP -8          Natural Supports (not living in the home):  Friends   Professional Supports: None   Employment: Unemployed   Type of Work:     Education:  9 to 11 years   Homebound arranged:    Surveyor, quantity Resources:  Medicaid   Other Resources:  [facility]   Cultural/Religious Considerations Which May Impact Care:    Strengths:  Ability to meet basic needs  , Merchandiser, retail, Home prepared for child     Psychotropic Medications:         Pediatrician:    [place] [facility])  Pediatrician List:   [facility] [place]    [place]      Pediatrician Fax Number:    Risk Factors/Current Problems:  Mental Health Concerns     Cognitive State:  Alert  , Linear Thinking  , Insightful     Mood/Affect:  Calm  , Interested  , Bright     CSW Assessment: CSW consulted for history of anxiety,  depression ,SI, LPNC and Edinburgh of 14.  CSW contacted MOB by phone to assess, due to Covid positive status. CSW introduced self and role. CSW verified MOB alone and timing of assessment is adequate. CSW informed MOB of reason for assessment and assessed current emotions. MOB reported she is currently "feeling okay". MOB went on to disclose she is a little sad due to her Covid positive status and not being to have a visitor while admitted. MOB shared she is happy to be discharged home today. MOB disclosed the beginning of the pregnancy was challenging, due to not being prepared after having had a baby last year. MOB expressed it caused difficulties between herself and FOB. MOB reported she is currently happy and feels she is bonding very well with infant. CSW discussed MOB's mental health history. MOB reported she was diagnosed with anxiety and depression last year. MOB stated she has been to counseling, however does not find it to be helpful. MOB has never taken medication to treat symptoms. CSW discussed coping skills with MOB, who stated she does not know how to cope when feelings of anxiety or depression arise. CSW encouraged MOB to try skills such as writing in a journal,  exercising, and utilizing supports for help when needed. CSW offered to provide MOB with support group resources, however MOB declined. CSW discussed MOB's previous hospitalization. MOB stated it was a isolated incident and denies experiencing any SI since that time. MOB identified one of her close friends as a support, in addition to her Health Department case worker Brittany. MOB denies any current SI, HI or DV.    CSW reported she lives with FOB and their two children. MOB receives WIC resources and stated FOB receives food stamp resources. MOB stated they have adequate food in the home at this time.  CSW discussed limited prenatal care with MOB, which was due to limited child care. MOB denies any barriers to newborn's follow-up care.  CSW informed MOB of the hospital drug screen policy. MOB aware an UDS/CDS will be completed on infant. MOB aware a CPS report will be made if infant test positive for substances. MOB expressed understanding and disclosed she had CPS history in 2021, due to FOB making allegations of drug use after she stayed out too long. MOB stated she was drug tested and the case was closed. MOB reported she has had other false reports made against her, however all the cases have been closed. MOB denies any history of substances use.   CSW provided education regarding the baby blues period versus PPD and provided resources. CSW provided the New Mom Checklist and encouraged MOB to self evaluate and contact a medical professional if symptoms are noted at any time.   CSW provided review of Sudden Infant Death Syndrome (SIDS) precautions. MOB stated she has all essentials, including a bassinet and car seat. MOB open to a referral to [facility]. MOB declined any additional resources and needs at this time.   CSW will continue to follow UDS/CDS and make a CPS report if warranted. CSW identifies no further need for intervention and no barriers to discharge at this time.   CSW Plan/Description:  No Further Intervention Required/No Barriers to Discharge, CSW Will Continue to Monitor Umbilical Cord Tissue Drug Screen Results and Make Report if Warranted, Perinatal Mood and Anxiety Disorder (PMADs) Education, Hospital Drug Screen Policy Information, Child Protective Service Report  , Sudden Infant Death Syndrome (SIDS) Education, Other Information/Referral to Community Resources    Ichelle Harral J Haydyn Liddell, LCSWA 11/13/2020, 10:18 AM 

## 2020-11-13 NOTE — Anesthesia Postprocedure Evaluation (Signed)
Anesthesia Post Note  Patient: Kristin Brock  Procedure(s) Performed: AN AD HOC LABOR EPIDURAL     Patient location during evaluation: Mother Baby Anesthesia Type: Epidural Level of consciousness: awake and alert Pain management: pain level controlled Vital Signs Assessment: post-procedure vital signs reviewed and stable Respiratory status: spontaneous breathing, nonlabored ventilation and respiratory function stable Cardiovascular status: stable Postop Assessment: no headache, no backache, epidural receding, no apparent nausea or vomiting, patient able to bend at knees, adequate PO intake and able to ambulate Anesthetic complications: no   No notable events documented.  Last Vitals:  Vitals:   11/13/20 0155 11/13/20 0535  BP: 99/84 110/66  Pulse: 70 62  Resp: 18 18  Temp: 36.6 C 36.4 C  SpO2: 99% 100%    Last Pain:  Vitals:   11/13/20 0535  TempSrc: Oral  PainSc:    Pain Goal:                   Laban Emperor

## 2020-11-14 LAB — BPAM RBC
Blood Product Expiration Date: 202209202359
Blood Product Expiration Date: 202209212359
Unit Type and Rh: 7300
Unit Type and Rh: 7300

## 2020-11-14 LAB — TYPE AND SCREEN
ABO/RH(D): B POS
Antibody Screen: POSITIVE
Unit division: 0
Unit division: 0

## 2020-11-15 ENCOUNTER — Encounter: Payer: Medicaid Other | Admitting: Certified Nurse Midwife

## 2020-11-23 ENCOUNTER — Telehealth (HOSPITAL_COMMUNITY): Payer: Self-pay

## 2020-11-23 DIAGNOSIS — Z1331 Encounter for screening for depression: Secondary | ICD-10-CM

## 2020-11-23 NOTE — Telephone Encounter (Signed)
No answer. Left message to return nurse call.  Hospital EPDS score on 11/13/20 was 14. Referral placed for integrated behavioral health. Dr. Jolayne Panther made aware of referral placement.   Marcelino Duster [facility] 11/23/2020,1755

## 2020-11-28 NOTE — BH Specialist Note (Signed)
Pt did not arrive to video visit and did not answer the phone; Left HIPPA-compliant message to call back Marlaya Turck from [facility] at  [phone] (Lacoya Wilbanks's office).  ?; left MyChart message for patient.  ? ?

## 2020-11-30 ENCOUNTER — Ambulatory Visit: Payer: Medicaid Other | Admitting: Clinical

## 2020-11-30 DIAGNOSIS — Z91199 Patient's noncompliance with other medical treatment and regimen due to unspecified reason: Secondary | ICD-10-CM

## 2020-11-30 DIAGNOSIS — Z5329 Procedure and treatment not carried out because of patient's decision for other reasons: Secondary | ICD-10-CM

## 2020-12-18 ENCOUNTER — Ambulatory Visit: Payer: Medicaid Other | Admitting: Medical

## 2021-02-02 ENCOUNTER — Ambulatory Visit: Payer: Medicaid Other | Admitting: Certified Nurse Midwife

## 2021-03-18 NOTE — L&D Delivery Note (Addendum)
OB/GYN Faculty Practice Delivery Note  Kristin Brock is a 26 y.o. [id number] s/p vaginal at [redacted]w[redacted]d. She was admitted for preterm labor with DiDi twins, IUGR in baby A, scant prenatal care.   ROM: 4h 69m with meconium fluid GBS Status: unknown, treated with ampicillin   Maximum Maternal Temperature: Temp (24hrs), Avg:97.9 F (36.6 C), Min:97.4 F (36.3 C), Max:98.4 F (36.9 C)  Labor Progress: Patient arrived at 5 cm dilation, progressed to 6cm and then had pitocin augmentation started and later underwent AROM  Baby A Delivery Date/Time: 11/08/2021 Baby A at 1639 Delivery: Patient easily progressed to complete and +3 and was then moved to the OR. Once set up, the patient easily delivered the baby, direct OA position, without issue.  Infant with spontaneous cry, placed on mother's abdomen, dried and stimulated. Cord clamped and cut after 1-minute delay. Placenta delivered without issue after delivery of baby B via active management. APGARS 8/9  Baby B Delivery Date/Time: 11/08/2021 Baby B at 1645 Delivery: During delayed cord clamping, ultrasound done and baby B still breech with head in LUQ and SVE done and cervix still fully dilated and B's bag of water still intact. After cord clamping and cutting for baby A, baby B's foot was grasped and then the second foot and hips and back; AROM for slightly bloody fluid noted during this time. The baby was then delivered breech using the standard maneuvers and without difficulty vaginal delivery completed with delivery of the feet and body. Cord clamped and cut by provider with out delay. Venous and arterial blood was collected.  Placenta delivered spontaneously with gentle cord traction, along with A's.  APGARs 3/8 Arterial pH 7.28, co2 54, bicarb 25.4 Venous pH 7.4, co2 40, bicarb 24.8  Lysteda given along with pitocin for active management of third stage and prophylaxis for PPH, with Fundus firm with massage but with boggy lower uterine segment boggy.  Methergine also ordered and Mel Almond was placed without issue   Labia, perineum, vagina, and cervix inspected with right periurethral laceration identified and repaired with 3-0 vicryl.  Placentas: Spontaneous, intact  Complications: postpartum hemorrhage due to atony  Lacerations: right periurethral repaired EBL: Analgesia: epidural    Derrel Nip, MD  11/08/2021 5:26 PM  Attestation of Attending Supervision of Fellow: Evaluation and management procedures were performed by the fellow under my supervision.  I have seen and examined the patient,  reviewed the fellow's note and chart, and I agree with the management and plan.   Cornelia Copa MD Attending [facility] [employer] [facility])

## 2021-06-13 ENCOUNTER — Ambulatory Visit (HOSPITAL_COMMUNITY)
Admission: EM | Admit: 2021-06-13 | Discharge: 2021-06-13 | Disposition: A | Discharge status: Home / Self Care | Payer: Medicaid Other | Attending: Family Medicine | Admitting: Family Medicine

## 2021-06-13 ENCOUNTER — Encounter (HOSPITAL_COMMUNITY): Payer: Self-pay

## 2021-06-13 DIAGNOSIS — M25561 Pain in right knee: Secondary | ICD-10-CM

## 2021-06-13 DIAGNOSIS — Z3201 Encounter for pregnancy test, result positive: Secondary | ICD-10-CM

## 2021-06-13 LAB — POC URINE PREG, ED: Preg Test, Ur: POSITIVE — AB

## 2021-06-13 MED ORDER — PRENATAL COMPLETE 14-0.4 MG PO TABS: 1.0000 | ORAL_TABLET | Freq: Every day | ORAL | 0 refills | Status: AC

## 2021-06-13 NOTE — Discharge Instructions (Addendum)
Keep your knee elevated.  Use your knee brace to provide support.  Use ice for symptom relief.  You can use Tylenol.  You should not be taking over-the-counter medications besides Tylenol because you are pregnant.  Call and schedule appointment with orthopedics if your symptoms persist. ? ?You are pregnant.  Please call to schedule an appointment with OB/GYN as soon as possible.  Start a prenatal vitamin.  Avoid over-the-counter medications with the exception of Tylenol.  Try to quit smoking as we discussed.  Avoid drugs and alcohol.  Make sure all of your food is properly cooked and avoid processed meat.  Avoid anything that raises your core body temperature (saunas or hot tubs).  If you develop any abdominal pain, pelvic pain, bleeding you need to go to the MAU (pregnancy emergency room) as we discussed. ?

## 2021-06-13 NOTE — ED Provider Notes (Signed)
?[place] ? ? ? ?CSN: [id number] ?Arrival date & time: 06/13/21  1942 ? ? ?  ? ?History   ?Chief Complaint ?Chief Complaint  ?Patient presents with  ? Knee Pain  ? Possible Pregnancy  ? ? ?HPI ?Kristin Brock is a 26 y.o. female.  ? ?Patient presents today with a several week history of right knee pain.  She reports falling at work with the majority of her weight landing on her knee and has had ongoing pain since that time.  At rest she does not have significant pain but this increases to 8/9 with flexion or attempting to bear weight, described as sharp, no aggravating relieving factors identified.  She denies any previous knee injury or surgery.  She has not been taking any over-the-counter medication for symptom management.  Denies any popping, clicking, instability.  She has been able to ambulate unassisted despite symptoms but it is difficult due to pain.   ? ?Patient requested pregnancy test.  LMP December 2022.  She is G4, P3.  She does not take medication on regular basis.  She has not tried taking multivitamin or prenatal vitamin.  She does not use drugs or drink.  She does smoke but is trying to quit.  She reports occasional abdominal pain but is currently asymptomatic.  Denies any significant vaginal discharge or bleeding.  She does not currently have an OB/GYN. ? ? ?Past Medical History:  ?Diagnosis Date  ? Anemia   ? Anxiety   ? Asthma   ? Chlamydia   ? Depression   ? Gonorrhea   ? Headache   ? Trichomonas infection   ? UTI (urinary tract infection)   ? ? ?Patient Active Problem List  ? Diagnosis Date Noted  ? Indication for care in labor and delivery, antepartum 11/12/2020  ? Supervision of low-risk pregnancy 07/27/2020  ? UTI (urinary tract infection) during pregnancy 10/11/2018  ? ? ?Past Surgical History:  ?Procedure Laterality Date  ? INDUCED ABORTION    ? NO PAST SURGERIES    ? ? ?OB History   ? ? Gravida  ?4  ? Para  ?3  ? Term  ?3  ? Preterm  ?0  ? AB  ?1  ? Living  ?3  ?  ? ? SAB  ?0   ? IAB  ?1  ? Ectopic  ?0  ? Multiple  ?0  ? Live Births  ?3  ?   ?  ?  ? ? ? ?Home Medications   ? ?Prior to Admission medications   ?Medication Sig Start Date End Date Taking? Authorizing Provider  ?Prenatal Vit-Fe Fumarate-FA (PRENATAL COMPLETE) 14-0.4 MG TABS Take 1 tablet by mouth daily. 06/13/21  Yes Al Bracewell, Derry Skill, PA-C  ?Blood Pressure Monitoring (BLOOD PRESSURE KIT) DEVI 1 Device by Does not apply route daily. ICD 10: Z34.00 01/06/19   Luvenia Redden, PA-C  ? ? ?Family History ?Family History  ?Problem Relation Age of Onset  ? Healthy Mother   ? Healthy Father   ? Cancer Maternal Grandmother   ? Pulmonary embolism Paternal Grandmother   ? ? ?Social History ?Social History  ? ?Tobacco Use  ? Smoking status: Every Day  ?  Packs/day: 0.25  ?  Years: 10.00  ?  Pack years: 2.50  ?  Types: Cigarettes  ? Smokeless tobacco: Never  ?Vaping Use  ? Vaping Use: Never used  ?Substance Use Topics  ? Alcohol use: Not Currently  ?  Comment: occasional  ?  Drug use: Never  ? ? ? ?Allergies   ?Patient has no known allergies. ? ? ?Review of Systems ?Review of Systems  ?Constitutional:  Positive for activity change. Negative for appetite change, fatigue and fever.  ?Respiratory:  Negative for cough and shortness of breath.   ?Cardiovascular:  Negative for chest pain.  ?Gastrointestinal:  Negative for abdominal pain, diarrhea, nausea and vomiting.  ?Genitourinary:  Positive for menstrual problem.  ?Musculoskeletal:  Positive for arthralgias and gait problem. Negative for joint swelling and myalgias.  ?Neurological:  Negative for dizziness, light-headedness and headaches.  ? ? ?Physical Exam ?Triage Vital Signs ?ED Triage Vitals  ?Enc Vitals Group  ?   BP 06/13/21 2007 (!) 151/70  ?   Pulse Rate 06/13/21 2007 74  ?   Resp 06/13/21 2007 20  ?   Temp 06/13/21 2007 98.6 ?F (37 ?C)  ?   Temp Source 06/13/21 2007 Oral  ?   SpO2 06/13/21 2007 99 %  ?   Weight --   ?   Height --   ?   Head Circumference --   ?   Peak Flow --   ?   Pain  Score 06/13/21 2006 5  ?   Pain Loc --   ?   Pain Edu? --   ?   Excl. in [place]? --   ? ?No data found. ? ?Updated Vital Signs ?BP (!) 151/70 (BP Location: Right Arm)   Pulse 74   Temp 98.6 ?F (37 ?C) (Oral)   Resp 20   LMP  (LMP Unknown)   SpO2 99%  ? ?Visual Acuity ?Right Eye Distance:   ?Left Eye Distance:   ?Bilateral Distance:   ? ?Right Eye Near:   ?Left Eye Near:    ?Bilateral Near:    ? ?Physical Exam ?Vitals reviewed.  ?Constitutional:   ?   General: She is awake. She is not in acute distress. ?   Appearance: Normal appearance. She is well-developed. She is not ill-appearing.  ?   Comments: Very pleasant female appears stated age in no acute distress sitting comfortably in exam room  ?HENT:  ?   Head: Normocephalic and atraumatic.  ?Cardiovascular:  ?   Rate and Rhythm: Normal rate and regular rhythm.  ?   Heart sounds: Normal heart sounds, S1 normal and S2 normal. No murmur heard. ?Pulmonary:  ?   Effort: Pulmonary effort is normal.  ?   Breath sounds: Normal breath sounds. No wheezing, rhonchi or rales.  ?   Comments: Clear to auscultation bilaterally ?Abdominal:  ?   General: Bowel sounds are normal.  ?   Palpations: Abdomen is soft.  ?   Tenderness: There is no abdominal tenderness. There is no right CVA tenderness, left CVA tenderness, guarding or rebound.  ?   Comments: Benign abdominal exam  ?Musculoskeletal:  ?   Right knee: Crepitus present. No swelling or bony tenderness. Decreased range of motion. Tenderness present. No LCL laxity, MCL laxity, ACL laxity or PCL laxity.  ?   Comments: Decreased range of motion with flexion.  Crepitus noted over lateral joint space.  No ligamentous laxity on exam.  Strength 5/5 bilateral lower extremity.  ?Psychiatric:     ?   Behavior: Behavior is cooperative.  ? ? ? ?UC Treatments / Results  ?Labs ?(all labs ordered are listed, but only abnormal results are displayed) ?Labs Reviewed  ?POC URINE PREG, ED - Abnormal; Notable for the following components:  ?  Result Value  ? Preg Test, Ur POSITIVE (*)   ? All other components within normal limits  ? ? ?EKG ? ? ?Radiology ?No results found. ? ?Procedures ?Procedures (including critical care time) ? ?Medications Ordered in UC ?Medications - No data to display ? ?Initial Impression / Assessment and Plan / UC Course  ?I have reviewed the triage vital signs and the nursing notes. ? ?Pertinent labs & imaging results that were available during my care of the patient were reviewed by me and considered in my medical decision making (see chart for details). ? ?  ? ?We are unable to get x-rays patient is currently pregnant.  Recommended conservative treatment measures including using a brace for support and comfort; she was provided a brace in clinic.  She can use Tylenol for pain relief.  Recommended RICE protocol.  Discussed that she should follow-up with orthopedics if symptoms persist as they could consider ultrasound or other ways to investigate ongoing knee pain that would not increase risk for baby.  She was provided work excuse note.  Discussed that if she has any worsening symptoms she is to return for reevaluation. ? ?Urine pregnancy was positive in clinic.  Patient was started on prenatal vitamin.  She is to continue smoking cessation efforts.  She is to avoid over-the-counter medications.  Recommended she eat fully cooked meals and avoid processed meats.  She is to avoid raising the core body temperature.  Discussed the importance of establishing with an OB/GYN and she was given contact information for local clinic with instruction to call to schedule appointment.  If she develops any abdominal pain, pelvic pain, bleeding she is to go to the MAU to which she expressed understanding. ? ?Final Clinical Impressions(s) / UC Diagnoses  ? ?Final diagnoses:  ?Acute pain of right knee  ?Positive pregnancy test  ? ? ? ?Discharge Instructions   ? ?  ?Keep your knee elevated.  Use your knee brace to provide support.  Use ice for  symptom relief.  You can use Tylenol.  You should not be taking over-the-counter medications besides Tylenol because you are pregnant.  Call and schedule appointment with orthopedics if your symptoms persist. ?

## 2021-06-13 NOTE — ED Triage Notes (Signed)
Pt presents with c/o knee pain x 3 weeks. Pt states she fell at work.  ? ?States she wants a pregnancy test.  ?

## 2021-07-10 ENCOUNTER — Telehealth (INDEPENDENT_AMBULATORY_CARE_PROVIDER_SITE_OTHER): Payer: Medicaid Other

## 2021-07-10 DIAGNOSIS — Z3480 Encounter for supervision of other normal pregnancy, unspecified trimester: Secondary | ICD-10-CM

## 2021-07-10 DIAGNOSIS — O0990 Supervision of high risk pregnancy, unspecified, unspecified trimester: Secondary | ICD-10-CM | POA: Insufficient documentation

## 2021-07-10 DIAGNOSIS — Z3A00 Weeks of gestation of pregnancy not specified: Secondary | ICD-10-CM

## 2021-07-10 MED ORDER — BLOOD PRESSURE MONITORING DEVI
1.0000 | 0 refills | Status: AC
Start: 2021-07-10 — End: ?

## 2021-07-10 NOTE — Progress Notes (Signed)
New OB Intake ? ?I connected with  Kristin Brock on 07/10/21 at  8:15 AM EDT by MyChart Video Visit and verified that I am speaking with the correct person using two identifiers. Nurse is located at [facility] and pt is located at [employer]. ? ?I discussed the limitations, risks, security and privacy concerns of performing an evaluation and management service by telephone and the availability of in person appointments. I also discussed with the patient that there may be a patient responsible charge related to this service. The patient expressed understanding and agreed to proceed. ? ?I explained I am completing New OB Intake today. We discussed her EDD of 12/03/21 that is based on LMP of 02/26/2021. Pt is G5/P3. I reviewed her allergies, medications, Medical/Surgical/OB history, and appropriate screenings. I informed her of [facility] services. Based on history, this is a/an  pregnancy uncomplicated .  ? ?Patient Active Problem List  ? Diagnosis Date Noted  ? Indication for care in labor and delivery, antepartum 11/12/2020  ? Supervision of low-risk pregnancy 07/27/2020  ? UTI (urinary tract infection) during pregnancy 10/11/2018  ? ? ?Concerns addressed today ? ?Delivery Plans:  ?Plans to deliver at [facility].  ? ?MyChart/Babyscripts ?MyChart access verified. I explained pt will have some visits in office and some virtually. Babyscripts instructions given and order placed. Patient verifies receipt of registration text/e-mail. Account successfully created and app downloaded. ? ?Blood Pressure Cuff  ?Blood pressure cuff ordered for patient to pick-up from [employer]. Explained after first prenatal appt pt will check weekly and document in Babyscripts. ? ?Weight scale: Patient does / does not  have weight scale. Weight scale ordered for patient to pick up from [employer].  ? ?Anatomy US ?Explained first scheduled [place] will be around 19 weeks. Anatomy US scheduled for 07/19/21 at 0200. Pt notified to arrive at  0145. ?Scheduled AFP lab only appointment if CenteringPregnancy pt for same day as anatomy US.  ? ?Labs ?Discussed Avelina Laine genetic screening with patient. Would like both Panorama and Horizon drawn at new OB visit.Also if interested in genetic testing, tell patient she will need AFP 15-21 weeks to complete genetic testing .Routine prenatal labs needed. ? ?Covid Vaccine ?Patient has covid vaccine.  ? ?Is patient a CenteringPregnancy candidate? Not a candidate due to dUE 12/03/21   "Centering Patient" indicated on sticky note ?  ?Is patient a Mom+Baby Combined Care candidate? Not a candidate   Scheduled with Mom+Baby provider  ?  ?Is patient interested in [place]? No  "Interested in [employer] - Schedule next visit with CNM" on sticky note ? ?Informed patient of [facility] website  and placed link in her AVS.  ? ?Social Determinants of Health ?Food Insecurity: Patient denies food insecurity. ?WIC Referral: Patient is interested in referral to [facility].  ?Transportation: Patient denies transportation needs. ?Childcare: Discussed no children allowed at ultrasound appointments. Offered childcare services; patient declines childcare services at this time. ? ?Send link to Pregnancy Navigators ? ? ?Placed OB Box on problem list and updated ? ?First visit review ?I reviewed new OB appt with pt. I explained she will have a pelvic exam, ob bloodwork with genetic screening, and PAP smear. Explained pt will be seen by Dr. Shawnie Pons at first visit; encounter routed to appropriate provider. Explained that patient will be seen by pregnancy navigator following visit with provider. [facility] information placed in AVS.  ? ?Henrietta Dine, CMA ?07/10/2021  8:32 AM  ?

## 2021-07-10 NOTE — Patient Instructions (Signed)

## 2021-07-19 ENCOUNTER — Other Ambulatory Visit: Payer: Self-pay | Admitting: *Deleted

## 2021-07-19 ENCOUNTER — Encounter: Payer: Self-pay | Admitting: Family Medicine

## 2021-07-19 ENCOUNTER — Ambulatory Visit: Payer: Medicaid Other | Admitting: *Deleted

## 2021-07-19 ENCOUNTER — Ambulatory Visit: Payer: Medicaid Other | Attending: Obstetrics and Gynecology | Admitting: Obstetrics and Gynecology

## 2021-07-19 ENCOUNTER — Ambulatory Visit: Payer: Medicaid Other | Attending: Family Medicine

## 2021-07-19 ENCOUNTER — Other Ambulatory Visit: Payer: Self-pay | Admitting: Family Medicine

## 2021-07-19 VITALS — BP 121/53 | HR 80

## 2021-07-19 DIAGNOSIS — Z3480 Encounter for supervision of other normal pregnancy, unspecified trimester: Secondary | ICD-10-CM

## 2021-07-19 DIAGNOSIS — O99212 Obesity complicating pregnancy, second trimester: Secondary | ICD-10-CM

## 2021-07-19 DIAGNOSIS — O99332 Smoking (tobacco) complicating pregnancy, second trimester: Secondary | ICD-10-CM | POA: Insufficient documentation

## 2021-07-19 DIAGNOSIS — O30042 Twin pregnancy, dichorionic/diamniotic, second trimester: Secondary | ICD-10-CM | POA: Diagnosis not present

## 2021-07-19 DIAGNOSIS — O30049 Twin pregnancy, dichorionic/diamniotic, unspecified trimester: Secondary | ICD-10-CM | POA: Insufficient documentation

## 2021-07-19 DIAGNOSIS — Z3A19 19 weeks gestation of pregnancy: Secondary | ICD-10-CM

## 2021-07-19 DIAGNOSIS — O09892 Supervision of other high risk pregnancies, second trimester: Secondary | ICD-10-CM

## 2021-07-19 DIAGNOSIS — Z363 Encounter for antenatal screening for malformations: Secondary | ICD-10-CM | POA: Insufficient documentation

## 2021-07-19 NOTE — Progress Notes (Signed)
Maternal-Fetal Medicine  ? ?Name: Kristin Brock ?DOB: 07-02-1995 ?MRN: [id number] ?Referring Provider: Tinnie Gens, MD ? ?I had the pleasure of seeing Kristin [facility] at [facility]. She is G5 P3013 at 19-weeks' gestation and is here for ultrasound evaluation.  ?Obstetric history is significant for 3 term vaginal deliveries.  All her children are in good health.  She had a most recent delivery in August 2022.  Before her last menstrual period, she has been having irregular cycles. ?She has not had screening for fetal aneuploidies and has her first OB appointment in 2 weeks. ?GYN history: No history of abnormal Pap smears or cervical surgeries.  No history of breast disease. ? ?Past medical history: No history of diabetes or hypertension or any chronic medical conditions.  Patient reports she does not have sickle cell trait. ?Past surgical history: D&C. ?Medications: None.  Patient has not started her prenatal vitamins. ?Allergies: No known drug allergies. ?Social history: Denies tobacco or drug or alcohol use.  Her partner, African American, is in good health.  He is a father of her all her children.  He has sickle cell trait. ?Family history: No history of venous thromboembolism in the family. ? ?Ultrasound ?We confirmed that dichorionic-diamniotic twin pregnancy.  The gestational age she is established by the measurement of the larger twin.  We have assigned her EDD at 12/07/2021. ? ?Twin A: Maternal left, cephalic presentation, posterior placenta, female fetus.  Fetal biometry is consistent with her newly established dates.  Amniotic fluid is normal and good fetal activity seen.  No markers of aneuploidies or fetal structural defects are seen. ? ?Twin B: Maternal right, cephalic presentation, anterior placenta, female fetus. Fetal biometry is consistent with her newly established dates.  Amniotic fluid is normal and good fetal activity seen.  No markers of aneuploidies or fetal structural  defects are seen. ? ?Growth discordancy: 10% (normal). ? ?Twin pregnancy  ?I explained the significance of chorionicity and its implications. Possible complications associated with twin pregnancy include preterm labor/delivery (most common), fetal growth restriction of one or both twins, malpresentations and increased cesarean delivery rate, postpartum hemorrhage. I also informed her that maternal complications including gestational diabetes and gestational hypertension/preeclampsia are more common. ? ?I discussed the mode of delivery that is based on the presentations.  If both have Vx/Vx or Vx/non-vertex presentations, vaginal delivery may be considered. In Vx/non-vx, vaginal delivery followed by internal podalic version of second twin will achieve vaginal delivery. In non-vx first twin presentation, cesarean delivery will be performed. ? ?I emphasized the importance of weight gain (24 lbs by 24 weeks) to improve fetal weight and decrease the chances of preterm delivery. ?Prophylactic low-dose aspirin delays or prevents preeclampsia. Twin pregnancy is at high risk for preeclampsia. I recommended aspirin (81 mg daily) to be started from now till delivery. She does not have contraindications to aspirin. ? ?I discussed cell free fetal DNA screening, its significance, and limitations.  Patient could not stay to have her blood drawn for screening.  She will be contacting her office later. ? ?Recommendations ?-An appointment was made for her to return in 4 weeks for completion of fetal anatomy.-Serial fetal growth assessments every 4 weeks. ?-Weekly antenatal testing at 36 and 37 weeks. ?-Delivery at 38 weeks.  ?-Aspirin 81 mg daily until delivery. ?Thank you for consultation.  If you have any questions or concerns, please contact me the [facility].  Consultation including face-to-face (more than 50%) counseling 30 minutes. ? ?

## 2021-08-09 ENCOUNTER — Encounter: Payer: Medicaid Other | Admitting: Family Medicine

## 2021-08-09 ENCOUNTER — Encounter: Payer: Self-pay | Admitting: Family Medicine

## 2021-08-09 NOTE — Progress Notes (Signed)
Patient did not keep appointment today. She will be called to reschedule.  

## 2021-08-20 ENCOUNTER — Ambulatory Visit: Payer: Medicaid Other

## 2021-08-20 ENCOUNTER — Ambulatory Visit: Payer: Medicaid Other | Attending: Obstetrics and Gynecology

## 2021-08-20 ENCOUNTER — Ambulatory Visit: Payer: Medicaid Other | Admitting: *Deleted

## 2021-08-20 VITALS — BP 127/67 | HR 84

## 2021-08-20 DIAGNOSIS — O99212 Obesity complicating pregnancy, second trimester: Secondary | ICD-10-CM | POA: Diagnosis not present

## 2021-08-20 DIAGNOSIS — O99332 Smoking (tobacco) complicating pregnancy, second trimester: Secondary | ICD-10-CM | POA: Diagnosis not present

## 2021-08-20 DIAGNOSIS — O0990 Supervision of high risk pregnancy, unspecified, unspecified trimester: Secondary | ICD-10-CM | POA: Insufficient documentation

## 2021-08-20 DIAGNOSIS — O09892 Supervision of other high risk pregnancies, second trimester: Secondary | ICD-10-CM | POA: Diagnosis present

## 2021-08-20 DIAGNOSIS — O30042 Twin pregnancy, dichorionic/diamniotic, second trimester: Secondary | ICD-10-CM

## 2021-08-20 DIAGNOSIS — F17210 Nicotine dependence, cigarettes, uncomplicated: Secondary | ICD-10-CM

## 2021-08-20 DIAGNOSIS — Z3A24 24 weeks gestation of pregnancy: Secondary | ICD-10-CM

## 2021-08-20 DIAGNOSIS — E669 Obesity, unspecified: Secondary | ICD-10-CM

## 2021-08-21 ENCOUNTER — Other Ambulatory Visit: Payer: Self-pay | Admitting: *Deleted

## 2021-08-21 DIAGNOSIS — O09899 Supervision of other high risk pregnancies, unspecified trimester: Secondary | ICD-10-CM

## 2021-08-21 DIAGNOSIS — O30042 Twin pregnancy, dichorionic/diamniotic, second trimester: Secondary | ICD-10-CM

## 2021-08-22 ENCOUNTER — Telehealth: Payer: Medicaid Other

## 2021-08-22 ENCOUNTER — Telehealth: Payer: Self-pay

## 2021-08-22 NOTE — Telephone Encounter (Signed)
Called Pt to start My Chart New OB Intake, no answer, left VM for call back. 

## 2021-08-27 ENCOUNTER — Inpatient Hospital Stay (HOSPITAL_COMMUNITY)
Admission: AD | Admit: 2021-08-27 | Discharge: 2021-08-27 | Disposition: A | Discharge status: Home / Self Care | Payer: Medicaid Other | Attending: Family Medicine | Admitting: Family Medicine

## 2021-08-27 ENCOUNTER — Encounter (HOSPITAL_COMMUNITY): Payer: Self-pay | Admitting: Family Medicine

## 2021-08-27 ENCOUNTER — Other Ambulatory Visit: Payer: Self-pay

## 2021-08-27 DIAGNOSIS — O0990 Supervision of high risk pregnancy, unspecified, unspecified trimester: Secondary | ICD-10-CM

## 2021-08-27 DIAGNOSIS — O23592 Infection of other part of genital tract in pregnancy, second trimester: Secondary | ICD-10-CM | POA: Insufficient documentation

## 2021-08-27 DIAGNOSIS — A5901 Trichomonal vulvovaginitis: Secondary | ICD-10-CM | POA: Diagnosis not present

## 2021-08-27 DIAGNOSIS — Z3A25 25 weeks gestation of pregnancy: Secondary | ICD-10-CM | POA: Diagnosis not present

## 2021-08-27 DIAGNOSIS — Z3689 Encounter for other specified antenatal screening: Secondary | ICD-10-CM

## 2021-08-27 DIAGNOSIS — O368120 Decreased fetal movements, second trimester, not applicable or unspecified: Secondary | ICD-10-CM | POA: Diagnosis not present

## 2021-08-27 DIAGNOSIS — O30042 Twin pregnancy, dichorionic/diamniotic, second trimester: Secondary | ICD-10-CM | POA: Diagnosis present

## 2021-08-27 DIAGNOSIS — A599 Trichomoniasis, unspecified: Secondary | ICD-10-CM

## 2021-08-27 DIAGNOSIS — O98312 Other infections with a predominantly sexual mode of transmission complicating pregnancy, second trimester: Secondary | ICD-10-CM | POA: Insufficient documentation

## 2021-08-27 DIAGNOSIS — Z3492 Encounter for supervision of normal pregnancy, unspecified, second trimester: Secondary | ICD-10-CM

## 2021-08-27 LAB — WET PREP, GENITAL
Clue Cells Wet Prep HPF POC: NONE SEEN
Sperm: NONE SEEN
WBC, Wet Prep HPF POC: >=10 — AB (ref <10)
Yeast Wet Prep HPF POC: NONE SEEN

## 2021-08-27 LAB — URINALYSIS, ROUTINE W REFLEX MICROSCOPIC
Bilirubin Urine: NEGATIVE
Glucose, UA: NEGATIVE mg/dL
Hgb urine dipstick: NEGATIVE
Ketones, ur: NEGATIVE mg/dL
Nitrite: NEGATIVE
Protein, ur: 30 mg/dL — AB
Specific Gravity, Urine: 1.017 (ref 1.005–1.030)
pH: 7 (ref 5.0–8.0)

## 2021-08-27 LAB — AMNISURE RUPTURE OF MEMBRANE (ROM) NOT AT ARMC: Amnisure ROM: NEGATIVE

## 2021-08-27 LAB — POCT FERN TEST: POCT Fern Test: NEGATIVE

## 2021-08-27 LAB — FETAL FIBRONECTIN: Fetal Fibronectin: NEGATIVE

## 2021-08-27 MED ORDER — METRONIDAZOLE 500 MG PO TABS: 2000.0000 mg | ORAL_TABLET | Freq: Once | ORAL | 0 refills | Status: AC

## 2021-08-27 MED ORDER — ONDANSETRON 4 MG PO TBDP
8.0000 mg | ORAL_TABLET | Freq: Once | ORAL | Status: AC
  Administered 2021-08-27: 8 mg via ORAL
  Filled 2021-08-27: qty 2

## 2021-08-27 MED ORDER — METRONIDAZOLE 500 MG PO TABS: 2000.0000 mg | ORAL_TABLET | Freq: Once | ORAL | Status: DC

## 2021-08-27 MED ORDER — METRONIDAZOLE 500 MG PO TABS
2000.0000 mg | ORAL_TABLET | Freq: Once | ORAL | Status: AC
  Administered 2021-08-27: 2000 mg via ORAL
  Filled 2021-08-27: qty 4

## 2021-08-27 NOTE — MAU Note (Signed)
Kristin Brock is a 26 y.o. at [redacted]w[redacted]d here in MAU reporting: at 0400, had clear fluid gushing out of her. Still coming, but not as heavy. No bleeding. Feeling pressure and tightness- constant. Has not felt movement today. Denies recent intercourse.  Been having diarrhea 3-4/day, started 2 days ago, watery and loose  Onset of complaint: 0400 Pain score: 6 Vitals:   08/27/21 1602  BP: 131/71  Pulse: 81  Resp: 18  Temp: 98.5 F (36.9 C)  SpO2: 100%     KDT:OIZTIW dress on Lab orders placed from triage:  urine

## 2021-08-27 NOTE — MAU Provider Note (Signed)
Event Date/Time   First Provider Initiated Contact with Patient 08/27/21 1650     S: [name] is a 26 y.o. [phone] at [redacted]w[redacted]d  who presents to MAU today complaining of leaking of fluid since 4am this morning. She denies vaginal bleeding. She denies contractions, just some pelvic pressure. She reports decreased fetal movement.    Receives care at [facility], has not been seen for this pregnancy except at MFM. Needs to call to get rescheduled. Pregnant with di/di twins, last baby is 53mo.  Pertinent items noted in HPI and remainder of comprehensive ROS otherwise negative.   O: BP (!) 118/44 (BP Location: Right Arm)   Pulse 74   Temp 98 F (36.7 C) (Oral)   Resp 17   Ht 5\' 4"  (1.626 m)   Wt 192 lb 11.2 oz (87.4 kg)   LMP 02/26/2021   SpO2 100%   BMI 33.08 kg/m  GENERAL: Well-developed, well-nourished female in no acute distress.  HEAD: Normocephalic, atraumatic.  CHEST: Normal effort of breathing, regular heart rate ABDOMEN: Soft, nontender, gravid PELVIC: Normal external female genitalia. Vagina is pink and rugated. Cervix with normal contour, no lesions. Frothy, thin white discharge.  Minimal pooling.   Cervical exam: visually closed   Fetal Monitoring: reactive Baseline: 145/150 Variability: moderate Accelerations: 10x10 (appropriate for gestational age) Decelerations: none Contractions: none  Results for orders placed or performed during the hospital encounter of 08/27/21 (from the past 24 hour(s))  Urinalysis, Routine w reflex microscopic Urine, Clean Catch     Status: Abnormal   Collection Time: 08/27/21  4:32 PM  Result Value Ref Range   Color, Urine YELLOW YELLOW   APPearance HAZY (A) CLEAR   Specific Gravity, Urine 1.017 1.005 - 1.030   pH 7.0 5.0 - 8.0   Glucose, UA NEGATIVE NEGATIVE mg/dL   Hgb urine dipstick NEGATIVE NEGATIVE   Bilirubin Urine NEGATIVE NEGATIVE   Ketones, ur NEGATIVE NEGATIVE mg/dL   Protein, ur 30 (A) NEGATIVE mg/dL   Nitrite NEGATIVE  NEGATIVE   Leukocytes,Ua MODERATE (A) NEGATIVE   RBC / HPF 0-5 0 - 5 RBC/hpf   WBC, UA 21-50 0 - 5 WBC/hpf   Bacteria, UA MANY (A) NONE SEEN   Squamous Epithelial / LPF 21-50 0 - 5   Mucus PRESENT    Non Squamous Epithelial 0-5 (A) NONE SEEN  Wet prep, genital     Status: Abnormal   Collection Time: 08/27/21  5:39 PM  Result Value Ref Range   Yeast Wet Prep HPF POC NONE SEEN NONE SEEN   Trich, Wet Prep PRESENT (A) NONE SEEN   Clue Cells Wet Prep HPF POC NONE SEEN NONE SEEN   WBC, Wet Prep HPF POC >=10 (A) <10   Sperm NONE SEEN   Amnisure rupture of membrane (rom)not at [facility]     Status: None   Collection Time: 08/27/21  5:39 PM  Result Value Ref Range   Amnisure ROM NEGATIVE   Fetal fibronectin     Status: None   Collection Time: 08/27/21  5:40 PM  Result Value Ref Range   Fetal Fibronectin NEGATIVE NEGATIVE  Fern Test     Status: None   Collection Time: 08/27/21  5:55 PM  Result Value Ref Range   POCT Fern Test Negative = intact amniotic membranes    MDM/MAU Course: 10/27/21 test and amnisure negative, frothy thin white discharge noted on speculum exam. Wet prep consistent with trichomonas.  Pt informed of results and given zofran ODT and  2000mg  metronidazole. Expedited partner treatment sent to pharmacy with instructions to avoid sexual contact for 2wks after treatment. Pt verbalized understanding.   NST reactive with palpable movement.  A: Di/di twin pregnancy at 106w3d gestational weeks Membranes intact Trichomonas vaginalis NST reactive  P: Discharge home in stable condition with 2nd trimester precautions Call [facility] to reschedule new OB intake and new OB visit.  Allergies as of 08/27/2021   No Known Allergies      Medication List     TAKE these medications    Blood Pressure Monitoring Devi 1 each by Does not apply route once a week.   metroNIDAZOLE 500 MG tablet Commonly known as: FLAGYL Take 4 tablets (2,000 mg total) by mouth once for 1 dose.    Prenatal Complete 14-0.4 MG Tabs Take 1 tablet by mouth daily.        10/27/2021, Kristin Brock 08/27/2021 7:39 PM

## 2021-08-28 LAB — GC/CHLAMYDIA PROBE AMP (~~LOC~~) NOT AT ARMC
Chlamydia: NEGATIVE
Comment: NEGATIVE
Comment: NORMAL
Neisseria Gonorrhea: NEGATIVE

## 2021-09-17 ENCOUNTER — Ambulatory Visit: Payer: Medicaid Other

## 2021-09-19 ENCOUNTER — Ambulatory Visit (HOSPITAL_BASED_OUTPATIENT_CLINIC_OR_DEPARTMENT_OTHER): Payer: Medicaid Other

## 2021-09-19 ENCOUNTER — Ambulatory Visit: Payer: Medicaid Other | Attending: Obstetrics and Gynecology | Admitting: *Deleted

## 2021-09-19 VITALS — BP 109/71 | HR 89

## 2021-09-19 DIAGNOSIS — O30043 Twin pregnancy, dichorionic/diamniotic, third trimester: Secondary | ICD-10-CM | POA: Diagnosis present

## 2021-09-19 DIAGNOSIS — O99213 Obesity complicating pregnancy, third trimester: Secondary | ICD-10-CM | POA: Insufficient documentation

## 2021-09-19 DIAGNOSIS — O09893 Supervision of other high risk pregnancies, third trimester: Secondary | ICD-10-CM | POA: Diagnosis not present

## 2021-09-19 DIAGNOSIS — O99333 Smoking (tobacco) complicating pregnancy, third trimester: Secondary | ICD-10-CM | POA: Insufficient documentation

## 2021-09-19 DIAGNOSIS — F17210 Nicotine dependence, cigarettes, uncomplicated: Secondary | ICD-10-CM

## 2021-09-19 DIAGNOSIS — Z3A28 28 weeks gestation of pregnancy: Secondary | ICD-10-CM | POA: Diagnosis not present

## 2021-09-19 DIAGNOSIS — O30042 Twin pregnancy, dichorionic/diamniotic, second trimester: Secondary | ICD-10-CM

## 2021-09-19 DIAGNOSIS — O09899 Supervision of other high risk pregnancies, unspecified trimester: Secondary | ICD-10-CM

## 2021-09-19 DIAGNOSIS — E669 Obesity, unspecified: Secondary | ICD-10-CM

## 2021-09-19 DIAGNOSIS — O0990 Supervision of high risk pregnancy, unspecified, unspecified trimester: Secondary | ICD-10-CM

## 2021-09-20 ENCOUNTER — Ambulatory Visit: Payer: Medicaid Other

## 2021-09-20 ENCOUNTER — Other Ambulatory Visit: Payer: Self-pay | Admitting: *Deleted

## 2021-09-20 DIAGNOSIS — O30043 Twin pregnancy, dichorionic/diamniotic, third trimester: Secondary | ICD-10-CM

## 2021-09-20 DIAGNOSIS — Z3689 Encounter for other specified antenatal screening: Secondary | ICD-10-CM

## 2021-09-20 DIAGNOSIS — O09899 Supervision of other high risk pregnancies, unspecified trimester: Secondary | ICD-10-CM

## 2021-09-26 ENCOUNTER — Encounter: Payer: Self-pay | Admitting: Obstetrics and Gynecology

## 2021-10-10 ENCOUNTER — Ambulatory Visit: Payer: Medicaid Other

## 2021-10-26 ENCOUNTER — Ambulatory Visit (HOSPITAL_BASED_OUTPATIENT_CLINIC_OR_DEPARTMENT_OTHER): Payer: Medicaid Other

## 2021-10-26 ENCOUNTER — Ambulatory Visit: Payer: Medicaid Other | Admitting: *Deleted

## 2021-10-26 ENCOUNTER — Ambulatory Visit: Payer: Medicaid Other | Attending: Maternal & Fetal Medicine | Admitting: *Deleted

## 2021-10-26 ENCOUNTER — Other Ambulatory Visit: Payer: Self-pay | Admitting: *Deleted

## 2021-10-26 ENCOUNTER — Other Ambulatory Visit: Payer: Self-pay | Admitting: Maternal & Fetal Medicine

## 2021-10-26 VITALS — BP 108/79 | HR 126

## 2021-10-26 DIAGNOSIS — O99333 Smoking (tobacco) complicating pregnancy, third trimester: Secondary | ICD-10-CM | POA: Insufficient documentation

## 2021-10-26 DIAGNOSIS — Z3A34 34 weeks gestation of pregnancy: Secondary | ICD-10-CM | POA: Diagnosis not present

## 2021-10-26 DIAGNOSIS — O99213 Obesity complicating pregnancy, third trimester: Secondary | ICD-10-CM | POA: Insufficient documentation

## 2021-10-26 DIAGNOSIS — O30043 Twin pregnancy, dichorionic/diamniotic, third trimester: Secondary | ICD-10-CM

## 2021-10-26 DIAGNOSIS — Z3689 Encounter for other specified antenatal screening: Secondary | ICD-10-CM

## 2021-10-26 DIAGNOSIS — O0990 Supervision of high risk pregnancy, unspecified, unspecified trimester: Secondary | ICD-10-CM

## 2021-10-26 DIAGNOSIS — O365931 Maternal care for other known or suspected poor fetal growth, third trimester, fetus 1: Secondary | ICD-10-CM

## 2021-10-26 DIAGNOSIS — F17210 Nicotine dependence, cigarettes, uncomplicated: Secondary | ICD-10-CM

## 2021-10-26 DIAGNOSIS — O09899 Supervision of other high risk pregnancies, unspecified trimester: Secondary | ICD-10-CM

## 2021-10-26 DIAGNOSIS — E669 Obesity, unspecified: Secondary | ICD-10-CM

## 2021-10-26 DIAGNOSIS — O283 Abnormal ultrasonic finding on antenatal screening of mother: Secondary | ICD-10-CM

## 2021-10-26 DIAGNOSIS — O09893 Supervision of other high risk pregnancies, third trimester: Secondary | ICD-10-CM | POA: Insufficient documentation

## 2021-10-26 NOTE — Procedures (Signed)
[name] [birth year] [redacted]w[redacted]d   Fetus B Non-Stress Test Interpretation for 10/26/21  Indication: Unsatisfactory BPP  Fetal Heart Rate Fetus B Mode: External Baseline Rate (B): 140 BPM Variability: Moderate Accelerations: 15 x 15 Decelerations: None  Uterine Activity Mode: Palpation, Toco Contraction Frequency (min): 4-7 Contraction Duration (sec): 40-60 Contraction Quality: Mild Resting Tone Palpated: Relaxed Resting Time: Adequate  Interpretation (Baby B - Fetal Testing) Nonstress Test Interpretation (Baby B): Reactive Overall Impression (Baby B): Reassuring for gestational age Comments (Baby B): Dr. Parke Poisson reviewed tracing  [name] [birth year] [redacted]w[redacted]d  Fetus A Non-Stress Test Interpretation for 10/26/21  Indication: Unsatisfactory BPP, SGA  Fetal Heart Rate A Mode: External Baseline Rate (A): 130 bpm Variability: Moderate Accelerations: 15 x 15 Decelerations: None Multiple birth?: Yes  Uterine Activity Mode: Palpation, Toco Contraction Frequency (min): 4-7 Contraction Duration (sec): 40-60 Contraction Quality: Mild Resting Tone Palpated: Relaxed Resting Time: Adequate  Interpretation (Fetal Testing) Nonstress Test Interpretation: Reactive Overall Impression: Reassuring for gestational age Comments: Dr. Parke Poisson reviewed tracing

## 2021-10-31 ENCOUNTER — Other Ambulatory Visit: Payer: Self-pay | Admitting: *Deleted

## 2021-10-31 DIAGNOSIS — O365931 Maternal care for other known or suspected poor fetal growth, third trimester, fetus 1: Secondary | ICD-10-CM

## 2021-10-31 DIAGNOSIS — O30043 Twin pregnancy, dichorionic/diamniotic, third trimester: Secondary | ICD-10-CM

## 2021-10-31 DIAGNOSIS — Z6831 Body mass index (BMI) 31.0-31.9, adult: Secondary | ICD-10-CM

## 2021-11-01 ENCOUNTER — Ambulatory Visit: Payer: Medicaid Other | Attending: Family Medicine

## 2021-11-01 ENCOUNTER — Ambulatory Visit: Payer: Medicaid Other

## 2021-11-07 ENCOUNTER — Encounter (HOSPITAL_COMMUNITY): Payer: Self-pay | Admitting: Obstetrics and Gynecology

## 2021-11-07 ENCOUNTER — Inpatient Hospital Stay (HOSPITAL_COMMUNITY)
Admission: AD | Admit: 2021-11-07 | Discharge: 2021-11-10 | DRG: 768 | Disposition: A | Discharge status: Home / Self Care | Payer: Medicaid Other | Attending: Obstetrics and Gynecology | Admitting: Obstetrics and Gynecology

## 2021-11-07 ENCOUNTER — Inpatient Hospital Stay (HOSPITAL_COMMUNITY): Payer: Medicaid Other | Admitting: Anesthesiology

## 2021-11-07 ENCOUNTER — Other Ambulatory Visit: Payer: Self-pay

## 2021-11-07 DIAGNOSIS — F17210 Nicotine dependence, cigarettes, uncomplicated: Secondary | ICD-10-CM | POA: Diagnosis present

## 2021-11-07 DIAGNOSIS — O30043 Twin pregnancy, dichorionic/diamniotic, third trimester: Secondary | ICD-10-CM | POA: Diagnosis present

## 2021-11-07 DIAGNOSIS — O365931 Maternal care for other known or suspected poor fetal growth, third trimester, fetus 1: Secondary | ICD-10-CM | POA: Diagnosis present

## 2021-11-07 DIAGNOSIS — O99334 Smoking (tobacco) complicating childbirth: Secondary | ICD-10-CM | POA: Diagnosis present

## 2021-11-07 DIAGNOSIS — O0930 Supervision of pregnancy with insufficient antenatal care, unspecified trimester: Secondary | ICD-10-CM

## 2021-11-07 DIAGNOSIS — O30049 Twin pregnancy, dichorionic/diamniotic, unspecified trimester: Secondary | ICD-10-CM | POA: Diagnosis not present

## 2021-11-07 DIAGNOSIS — Z3A35 35 weeks gestation of pregnancy: Secondary | ICD-10-CM

## 2021-11-07 DIAGNOSIS — Z8659 Personal history of other mental and behavioral disorders: Secondary | ICD-10-CM

## 2021-11-07 DIAGNOSIS — O321XX2 Maternal care for breech presentation, fetus 2: Secondary | ICD-10-CM | POA: Diagnosis present

## 2021-11-07 DIAGNOSIS — O09893 Supervision of other high risk pregnancies, third trimester: Secondary | ICD-10-CM

## 2021-11-07 HISTORY — DX: Personal history of other mental and behavioral disorders: Z86.59

## 2021-11-07 LAB — CBC
HCT: 29.6 % — ABNORMAL LOW (ref 36.0–46.0)
Hemoglobin: 9.5 g/dL — ABNORMAL LOW (ref 12.0–15.0)
MCH: 27.9 pg (ref 26.0–34.0)
MCHC: 32.1 g/dL (ref 30.0–36.0)
MCV: 86.8 fL (ref 80.0–100.0)
Platelets: 134 10*3/uL — ABNORMAL LOW (ref 150–400)
RBC: 3.41 MIL/uL — ABNORMAL LOW (ref 3.87–5.11)
RDW: 17.6 % — ABNORMAL HIGH (ref 11.5–15.5)
WBC: 9.8 10*3/uL (ref 4.0–10.5)
nRBC: 0.4 % — ABNORMAL HIGH (ref 0.0–0.2)

## 2021-11-07 LAB — URINALYSIS, ROUTINE W REFLEX MICROSCOPIC
Bilirubin Urine: NEGATIVE
Glucose, UA: NEGATIVE mg/dL
Ketones, ur: NEGATIVE mg/dL
Nitrite: NEGATIVE
Protein, ur: NEGATIVE mg/dL
Specific Gravity, Urine: 1.004 — ABNORMAL LOW (ref 1.005–1.030)
pH: 7 (ref 5.0–8.0)

## 2021-11-07 LAB — HEPATITIS B SURFACE ANTIGEN: Hepatitis B Surface Ag: NONREACTIVE

## 2021-11-07 LAB — RAPID URINE DRUG SCREEN, HOSP PERFORMED
Amphetamines: NOT DETECTED
Barbiturates: NOT DETECTED
Benzodiazepines: NOT DETECTED
Cocaine: NOT DETECTED
Opiates: NOT DETECTED
Tetrahydrocannabinol: POSITIVE — AB

## 2021-11-07 LAB — HIV ANTIBODY (ROUTINE TESTING W REFLEX): HIV Screen 4th Generation wRfx: NONREACTIVE

## 2021-11-07 LAB — HEPATITIS C ANTIBODY: HCV Ab: NONREACTIVE

## 2021-11-07 MED ORDER — SODIUM CHLORIDE 0.9 % IV SOLN
2.0000 g | Freq: Once | INTRAVENOUS | Status: AC
  Administered 2021-11-07: 2 g via INTRAVENOUS
  Filled 2021-11-07: qty 2000

## 2021-11-07 MED ORDER — ACETAMINOPHEN 325 MG PO TABS: 650.0000 mg | ORAL_TABLET | ORAL | Status: DC | PRN

## 2021-11-07 MED ORDER — ACETAMINOPHEN 500 MG PO TABS
1000.0000 mg | ORAL_TABLET | Freq: Once | ORAL | Status: AC
  Administered 2021-11-07: 1000 mg via ORAL
  Filled 2021-11-07: qty 2

## 2021-11-07 MED ORDER — FENTANYL CITRATE (PF) 100 MCG/2ML IJ SOLN
100.0000 ug | INTRAMUSCULAR | Status: DC | PRN
  Administered 2021-11-07: 100 ug via INTRAVENOUS

## 2021-11-07 MED ORDER — EPHEDRINE 5 MG/ML INJ: 10.0000 mg | INTRAVENOUS | Status: DC | PRN

## 2021-11-07 MED ORDER — LIDOCAINE HCL (PF) 1 % IJ SOLN
INTRAMUSCULAR | Status: DC | PRN
  Administered 2021-11-07 (×2): 5 mL via EPIDURAL

## 2021-11-07 MED ORDER — FENTANYL-BUPIVACAINE-NACL 0.5-0.125-0.9 MG/250ML-% EP SOLN
EPIDURAL | Status: AC
  Filled 2021-11-07: qty 250

## 2021-11-07 MED ORDER — LACTATED RINGERS IV SOLN
500.0000 mL | INTRAVENOUS | Status: DC | PRN
  Administered 2021-11-07 (×2): 500 mL via INTRAVENOUS

## 2021-11-07 MED ORDER — PHENYLEPHRINE 80 MCG/ML (10ML) SYRINGE FOR IV PUSH (FOR BLOOD PRESSURE SUPPORT): 80.0000 ug | PREFILLED_SYRINGE | INTRAVENOUS | Status: DC | PRN

## 2021-11-07 MED ORDER — LACTATED RINGERS IV SOLN: 500.0000 mL | Freq: Once | INTRAVENOUS | Status: DC

## 2021-11-07 MED ORDER — ONDANSETRON HCL 4 MG/2ML IJ SOLN
INTRAMUSCULAR | Status: AC
  Filled 2021-11-07: qty 2

## 2021-11-07 MED ORDER — FENTANYL-BUPIVACAINE-NACL 0.5-0.125-0.9 MG/250ML-% EP SOLN
12.0000 mL/h | EPIDURAL | Status: DC | PRN
  Administered 2021-11-07: 12 mL/h via EPIDURAL
  Filled 2021-11-07: qty 250

## 2021-11-07 MED ORDER — ONDANSETRON HCL 4 MG/2ML IJ SOLN
4.0000 mg | Freq: Four times a day (QID) | INTRAMUSCULAR | Status: DC
  Administered 2021-11-07 – 2021-11-08 (×2): 4 mg via INTRAVENOUS
  Filled 2021-11-07: qty 2

## 2021-11-07 MED ORDER — DIPHENHYDRAMINE HCL 50 MG/ML IJ SOLN
12.5000 mg | INTRAMUSCULAR | Status: AC | PRN
  Administered 2021-11-07 – 2021-11-08 (×3): 12.5 mg via INTRAVENOUS
  Filled 2021-11-07 (×2): qty 1

## 2021-11-07 MED ORDER — SOD CITRATE-CITRIC ACID 500-334 MG/5ML PO SOLN
30.0000 mL | ORAL | Status: DC | PRN
  Administered 2021-11-08: 30 mL via ORAL
  Filled 2021-11-07: qty 30

## 2021-11-07 MED ORDER — FENTANYL CITRATE (PF) 100 MCG/2ML IJ SOLN
INTRAMUSCULAR | Status: AC
  Filled 2021-11-07: qty 2

## 2021-11-07 MED ORDER — OXYTOCIN BOLUS FROM INFUSION
333.0000 mL | Freq: Once | INTRAVENOUS | Status: DC
  Administered 2021-11-08: 333 mL via INTRAVENOUS

## 2021-11-07 MED ORDER — PHENYLEPHRINE 80 MCG/ML (10ML) SYRINGE FOR IV PUSH (FOR BLOOD PRESSURE SUPPORT)
PREFILLED_SYRINGE | INTRAVENOUS | Status: AC
  Filled 2021-11-07: qty 10

## 2021-11-07 MED ORDER — OXYTOCIN-SODIUM CHLORIDE 30-0.9 UT/500ML-% IV SOLN: 2.5000 [IU]/h | INTRAVENOUS | Status: DC

## 2021-11-07 MED ORDER — LIDOCAINE HCL (PF) 1 % IJ SOLN: 30.0000 mL | INTRAMUSCULAR | Status: DC | PRN

## 2021-11-07 MED ORDER — LACTATED RINGERS IV SOLN: INTRAVENOUS | Status: DC

## 2021-11-07 MED ORDER — SODIUM CHLORIDE 0.9 % IV SOLN
1.0000 g | INTRAVENOUS | Status: DC
  Administered 2021-11-07 – 2021-11-08 (×6): 1 g via INTRAVENOUS
  Filled 2021-11-07 (×6): qty 1000

## 2021-11-07 NOTE — Anesthesia Procedure Notes (Signed)
Epidural Patient location during procedure: OB Start time: 11/07/2021 11:38 AM End time: 11/07/2021 11:46 AM  Staffing Anesthesiologist: Achille Rich, MD Performed: anesthesiologist   Preanesthetic Checklist Completed: patient identified, IV checked, site marked, risks and benefits discussed, monitors and equipment checked, pre-op evaluation and timeout performed  Epidural Patient position: sitting Prep: DuraPrep Patient monitoring: heart rate, cardiac monitor, continuous pulse ox and blood pressure Approach: midline Location: L2-L3 Injection technique: LOR saline  Needle:  Needle type: Tuohy  Needle gauge: 17 G Needle length: 9 cm Needle insertion depth: 6 cm Catheter type: closed end flexible Catheter size: 19 Gauge Catheter at skin depth: 12 cm Test dose: negative and Other  Assessment Events: blood not aspirated, injection not painful, no injection resistance and negative IV test  Additional Notes Informed consent obtained prior to proceeding including risk of failure, 1% risk of PDPH, risk of minor discomfort and bruising.  Discussed rare but serious complications including epidural abscess, permanent nerve injury, epidural hematoma.  Discussed alternatives to epidural analgesia and patient desires to proceed.  Timeout performed pre-procedure verifying patient name, procedure, and platelet count.  Patient tolerated procedure well. Reason for block:procedure for pain

## 2021-11-07 NOTE — Progress Notes (Signed)
Kristin Brock is a 26 y.o. [id number] at 108w5d by ultrasound admitted for spontaneous onset of labor with Di/DI twins and IUGR in Baby A. Patient states contractions started last night and continued to get worse and decided to come in for assessement.   Subjective: Patient is post epidural. And Epidural is working well. She is having itching as a side effect but is otherwise doing well.   Objective: BP 105/77   Pulse (!) 126   Temp 98.5 F (36.9 C) (Oral)   Resp 18   Ht 5\' 3"  (1.6 m)   Wt 86.7 kg   LMP 02/26/2021   SpO2 100%   BMI 33.85 kg/m  No intake/output data recorded. No intake/output data recorded.   Bedside 14/02/2021 performed  FHT A Vertex :  FHR: 135 bpm, variability: moderate,  accelerations:  Present,  decelerations:  Absent UC:   irregular, every 5-7 minutes  FHT B Breech:  FHR: 145 bpm, variability: moderate,  accelerations:  Present,  decelerations:  Absent  SVE:   Dilation: 5 Effacement (%): 80 Station: -2 Exam by:: lisa leftwich kirby cnm  Labs: Lab Results  Component Value Date   WBC 9.8 11/07/2021   HGB 9.5 (L) 11/07/2021   HCT 29.6 (L) 11/07/2021   MCV 86.8 11/07/2021   PLT 134 (L) 11/07/2021    Assessment / Plan: Spontaneous labor. Exam being deferred given Gestational age.   Labor:  Continue with expectant management with Preterm Labor. Fetal Wellbeing:  Category I & Cat I continue to monitor for signs of fetal distress.  Pain Control:  Epidural- Working well for patient.  I/D:   GBS Unknown being treated with Amp. Results pending.  Anticipated MOD:  NSVD with the possibility of C/S if necessary   11/09/2021, CNM 11/07/2021, 5:39 PM

## 2021-11-07 NOTE — H&P (Signed)
Kristin Brock is a 26 y.o. female presenting for spontaneous onset of labor at [redacted]w[redacted]d with Di/Di twins with IUGR. Baby A 3rd %tile and Baby B 15%tile. A- Vertex B Transverse  OB History     Gravida  5   Para  3   Term  3   Preterm  0   AB  1   Living  3      SAB  0   IAB  1   Ectopic  0   Multiple  0   Live Births  3          Past Medical History:  Diagnosis Date   Anemia    Anxiety    Asthma    Chlamydia    Depression    Gonorrhea    Headache    Trichomonas infection    UTI (urinary tract infection)    Past Surgical History:  Procedure Laterality Date   INDUCED ABORTION     NO PAST SURGERIES     Family History: family history includes Asthma in her mother and sister; Cancer in her maternal grandmother; Healthy in her father and mother; Pulmonary embolism in her paternal grandmother; Stroke in her paternal aunt. Social History:  reports that she has been smoking cigarettes. She has a 2.50 pack-year smoking history. She has never used smokeless tobacco. She reports that she does not currently use alcohol. She reports that she does not use drugs.     Maternal Diabetes: No Genetic Screening: Normal Maternal Ultrasounds/Referrals: Normal Fetal Ultrasounds or other Referrals:  None Maternal Substance Abuse:  No Significant Maternal Medications:  None Significant Maternal Lab Results:  Other: GBS Unknown  Number of Prenatal Visits:Less than or equal to 3 verified prenatal visits Other Comments:   Last [place] on 10/26/21 Fetus A (boy) cephalic presentation with a posterior placenta EFW 1817gm 4lb in the 3rd %tile. Fetus B (girl) Breech (Transverse on BSUS on 8/23) with an anterior placenta. EFW 4lbs 9oz 15%tile.  Di/DI     Review of Systems Maternal Medical History:  Reason for admission: Contractions.   Contractions: Onset was 13-24 hours ago.   Frequency: regular.   Fetal activity: Perceived fetal activity is normal.   Prenatal complications: Baby A   Prenatal Complications - Diabetes: none.   Dilation: 5 Effacement (%): 80 Station: -2 Exam by:: lisa leftwich kirby cnm Blood pressure 107/85, pulse 98, temperature 98.1 F (36.7 C), temperature source Oral, resp. rate 16, height 5\' 3"  (1.6 m), weight 86.7 kg, last menstrual period 02/26/2021, SpO2 98 %, unknown if currently breastfeeding. Maternal Exam:  Uterine Assessment: Contraction strength is moderate.  Contraction frequency is irregular.  Abdomen: Baby A- Vertex  Baby B- Transverse    Physical Exam Vitals and nursing note reviewed.  Constitutional:      General: She is not in acute distress.    Appearance: Normal appearance.  HENT:     Head: Normocephalic.  Cardiovascular:     Rate and Rhythm: Normal rate and regular rhythm.  Pulmonary:     Effort: Pulmonary effort is normal.  Abdominal:     Comments: Pregnant   Musculoskeletal:     Cervical back: Normal range of motion.  Skin:    General: Skin is warm and dry.  Neurological:     Mental Status: She is alert and oriented to person, place, and time.  Psychiatric:        Mood and Affect: Mood normal.        Behavior: Behavior  normal.     Prenatal labs: ABO, Rh: --/--/B POS (08/28 0830) Antibody: POS (08/28 0830) Rubella:  PENDING (previously immune)  RPR: NON REACTIVE (08/28 0830)  HBsAg:   Negative 2022 (PENDING)  HIV:   Non-Reactive 2022 (PENDING)  GBS:   Unknown   Assessment/Plan: [id number] at [redacted]w[redacted]d in SOL with Di/Di twins.   - Admit to L&D and continue with expectant management. - FWB: Cat 1 & Cat 1. Continue to monitor for signs of fetal distress. - I&D: GBS unknown at 35w.5d Plan for GBS prophylaxis with Ampicillin if no allergy.  - Previous SVD x3. Proven pelvis up to 3535g. Plan for SVD. MD and NICU at bedside for delivery for PTB and malposition of fetus B.  - Patient may have an epidural upon request if she desires.     Claudette Head, MSN CNM  11/07/2021, 10:30 AM

## 2021-11-07 NOTE — Progress Notes (Signed)
Labor Progress Note Kristin Brock is a 26 y.o. [phone] at [redacted]w[redacted]d presented for SOL with DiDi twins, IUGR baby A  S: Pt doing well, Epidural in place  O:  BP (!) 106/56   Pulse (!) 115   Temp 97.9 F (36.6 C) (Oral)   Resp 16   Ht 5\' 3"  (1.6 m)   Wt 86.7 kg   LMP 02/26/2021   SpO2 100%   BMI 33.85 kg/m  EFM:  Baby A: 145 bpm/Moderate variability/ 10x10 accels/ None decels Baby B: 145 bpm/Moderate variability/ 10x10 accels/ None decels   CVE: Dilation: 6 Effacement (%): 90 Station: -2 Presentation: Vertex Exam by:: Dr. [email or web address]   A&P: 26 y.o. 25 [redacted]w[redacted]d SOL as above #Labor: Progressing well. SVE as above. Will provide expectant management. #Pain: Epidural #FWB: CAT 1, CAT 1 #GBS  unknown- Ampicillin  completed  [redacted]w[redacted]d, DO 9:14 PM

## 2021-11-07 NOTE — MAU Provider Note (Signed)
Chief Complaint:  Contractions   Event Date/Time   First Provider Initiated Contact with Patient 11/07/21 1017      HPI: Kristin Brock is a 26 y.o. [id number] at [redacted]w[redacted]d by 19 week [place] who presents to maternity admissions reporting onset of painful but irregular contractions last night, 11/06/21, that worsened this morning when she woke up and have become progressively more painful   She is feeling normal fetal movement. She denies any leaking fluid but reports losing her mucus plug this morning.   HPI  Past Medical History: Past Medical History:  Diagnosis Date   Anemia    Anxiety    Asthma    Chlamydia    Depression    Gonorrhea    Headache    Trichomonas infection    UTI (urinary tract infection)     Past obstetric history: OB History  Gravida Para Term Preterm AB Living  5 3 3  0 1 3  SAB IAB Ectopic Multiple Live Births  0 1 0 0 3    # Outcome Date GA Lbr Len/2nd Weight Sex Delivery Anes PTL Lv  5 Current           4 Term 11/12/20 [redacted]w[redacted]d 11:00 / 00:09 3535 g M Vag-Spont EPI  LIV  3 IAB 11/19/19          2 Term 04/24/19 [redacted]w[redacted]d 08:11 / 00:06 3545 g M Vag-Spont EPI  LIV     Birth Comments: WDL  1 Term 03/22/14   3175 g F Vag-Spont   LIV     Birth Comments: fainting, asthma exacerbations    Past Surgical History: Past Surgical History:  Procedure Laterality Date   INDUCED ABORTION     NO PAST SURGERIES      Family History: Family History  Problem Relation Age of Onset   Asthma Mother    Healthy Mother    Healthy Father    Asthma Sister    Stroke Paternal Aunt    Cancer Maternal Grandmother    Pulmonary embolism Paternal Grandmother    Birth defects Neg Hx    Drug abuse Neg Hx    Heart disease Neg Hx    Hypertension Neg Hx     Social History: Social History   Tobacco Use   Smoking status: Every Day    Packs/day: 0.25    Years: 10.00    Total pack years: 2.50    Types: Cigarettes   Smokeless tobacco: Never  Vaping Use   Vaping Use: Never used   Substance Use Topics   Alcohol use: Not Currently    Comment: occasional   Drug use: Never    Allergies: No Known Allergies  Meds:  Medications Prior to Admission  Medication Sig Dispense Refill Last Dose   Blood Pressure Monitoring DEVI 1 each by Does not apply route once a week. 1 each 0    Prenatal Vit-Fe Fumarate-FA (PRENATAL COMPLETE) 14-0.4 MG TABS Take 1 tablet by mouth daily. 60 tablet 0     ROS:  Review of Systems  Constitutional:  Negative for chills, fatigue and fever.  Eyes:  Negative for visual disturbance.  Respiratory:  Negative for shortness of breath.   Cardiovascular:  Negative for chest pain.  Gastrointestinal:  Positive for abdominal pain. Negative for nausea and vomiting.  Genitourinary:  Positive for pelvic pain. Negative for difficulty urinating, dysuria, flank pain, vaginal bleeding, vaginal discharge and vaginal pain.  Musculoskeletal:  Positive for back pain.  Neurological:  Negative for dizziness and  headaches.  Psychiatric/Behavioral: Negative.       I have reviewed patient's Past Medical Hx, Surgical Hx, Family Hx, Social Hx, medications and allergies.   Physical Exam  Patient Vitals for the past 24 hrs:  BP Temp Temp src Pulse Resp SpO2 Height Weight  11/07/21 1015 107/85 98.1 F (36.7 C) Oral 98 16 98 % -- --  11/07/21 1012 -- -- -- -- -- -- 5\' 3"  (1.6 m) 86.7 kg   Constitutional: Well-developed, well-nourished female in no acute distress.  Cardiovascular: normal rate Respiratory: normal effort GI: Abd soft, non-tender, gravid appropriate for gestational age.  MS: Extremities nontender, no edema, normal ROM Neurologic: Alert and oriented x 4.  GU: Neg CVAT.  PELVIC EXAM:   Dilation: 5 Effacement (%): 80 Station: -2 Exam by:: [name] cnm  FHT:  Baby A: FHT 140s, intermittent tracing, adjusted for improved tracing while in MAU           Baby B: Baseline 145 , moderate variability, accelerations present, no  decelerations Contractions: every 2-3 minutes timed   Labs: No results found for this or any previous visit (from the past 24 hour(s)). --/--/B POS (08/28 0830)  Imaging:   MAU Course/MDM: Orders Placed This Encounter  Procedures   Culture, beta strep (group b only)   Urinalysis, Routine w reflex microscopic Urine, Clean Catch   RPR   HIV Antibody (routine testing w rflx)   Hepatitis B surface antigen   Rubella screen   CBC   Rapid urine drug screen (hospital performed)   Patient may have epidural   Patient may have epidural   May use local infiltration of 1% lidocaine plain to produce a skin wheal prior to IV insertion   Notify in-house Anesthesia team of nausea and vomiting greater than 5 hours   Assess for signs/symptoms of PIH/preeclampsia   RN to place order for: CBC if one has not been drawn in the past 6 hours for all patients with hypertensive disease, pre-eclampsia, eclampsia, thrombocytopenia or previous PLTC<150,000.   Identify to Anesthesia if patient plans to have postpartum tubal ligation; do not remove epidural without discussion with Anesthesiologist   Vital signs following Epidural Placement, re-bolus or re-dose monitor patient's BP and oxygen saturation every 5 minutes for 30 minutes   Pain Assessment Document numeric pain score   RN to remain at bedside continuously for 30 minutes post epidural placement, post re-bolus / re-dose   Notify Anesthesia if the patient becomes short of breath or complains of heaviness in chest, chest pain, and/or unrelieved pain   Notify Anesthesia prior to discontinuing epidural infusion   Type and screen [facility]    Meds ordered this encounter  Medications   lactated ringers infusion 500-1,000 mL   phenylephrine 80 mcg/10 mL injection    [facility] K: cabinet override   fentaNYL 2 mcg/mL w/bupivacaine 0.125% in NS 250 mL 0.5-0.125-0.9 MG/250ML-%    08-27-1981 K: cabinet override   ePHEDrine injection  10 mg   PHENYLephrine 80 mcg/ml in normal saline Adult IV Push Syringe (For Blood Pressure Support)   lactated ringers infusion 500 mL   fentaNYL 2 mcg/mL w/ bupivacaine 0.125% in NS 250 mL epidural infusion   diphenhydrAMINE (BENADRYL) injection 12.5 mg   ePHEDrine injection 10 mg   PHENYLephrine 80 mcg/ml in normal saline Adult IV Push Syringe (For Blood Pressure Support)     NST reviewed and reactive Baby B, Baby A with normal FHT, additional monitoring  needed on admission for full evaluation Cervix 5, soft, stretches to 7 cm, fetal head for baby A well applied. Pt breathing with regular contractions. Likely active preterm labor.   Consult Dr Ashok Pall with presentation, exam findings and test results.  With Baby A's position confirmed by bedside US as vertex, admit to L&D.  PCN for GBS unknown, GBS swab collected on admission   Assessment: 1. Dichorionic diamniotic twin pregnancy in third trimester   2. Preterm labor without delivery     Plan: Admit to L&D Report to Dorathy Daft, CNM, for admission    Sharen Counter Certified Nurse-Midwife 11/07/2021 10:50 AM

## 2021-11-07 NOTE — Progress Notes (Signed)
Difficult to trace fhr's due to patient itching stomach and moving constantly.  Medicated for itching.

## 2021-11-07 NOTE — MAU Note (Signed)
Kristin Brock is a 26 y.o. at [redacted]w[redacted]d here in MAU reporting: was having contractions over night and this AM around 5 they got closer and worse. Unsure about frequency. Denies bleeding or LOF. +FM x2  Onset of complaint: yesterday  Pain score: 7/10  Vitals:   11/07/21 1015  BP: 107/85  Pulse: 98  Resp: 16  Temp: 98.1 F (36.7 C)  SpO2: 98%     FHT:EFM applied in room  Lab orders placed from triage: UA

## 2021-11-07 NOTE — Anesthesia Preprocedure Evaluation (Signed)
Anesthesia Evaluation  Patient identified by MRN, date of birth, ID band Patient awake    Reviewed: Allergy & Precautions, H&P , NPO status , Patient's Chart, lab work & pertinent test results  Airway Mallampati: II   Neck ROM: full    Dental   Pulmonary asthma , Current Smoker,    breath sounds clear to auscultation       Cardiovascular negative cardio ROS   Rhythm:regular Rate:Normal     Neuro/Psych  Headaches, PSYCHIATRIC DISORDERS Anxiety Depression    GI/Hepatic   Endo/Other    Renal/GU      Musculoskeletal   Abdominal   Peds  Hematology  (+) Blood dyscrasia, anemia ,   Anesthesia Other Findings   Reproductive/Obstetrics (+) Pregnancy                             Anesthesia Physical Anesthesia Plan  ASA: 2  Anesthesia Plan: Epidural   Post-op Pain Management:    Induction: Intravenous  PONV Risk Score and Plan: 1 and Treatment may vary due to age or medical condition  Airway Management Planned: Natural Airway  Additional Equipment:   Intra-op Plan:   Post-operative Plan:   Informed Consent: I have reviewed the patients History and Physical, chart, labs and discussed the procedure including the risks, benefits and alternatives for the proposed anesthesia with the patient or authorized representative who has indicated his/her understanding and acceptance.     Dental advisory given  Plan Discussed with: Anesthesiologist  Anesthesia Plan Comments:         Anesthesia Quick Evaluation

## 2021-11-08 ENCOUNTER — Encounter (HOSPITAL_COMMUNITY): Payer: Self-pay | Admitting: Obstetrics and Gynecology

## 2021-11-08 ENCOUNTER — Encounter (HOSPITAL_COMMUNITY)
Admission: AD | Disposition: A | Discharge status: Home / Self Care | Payer: Self-pay | Source: Home / Self Care | Attending: Obstetrics and Gynecology

## 2021-11-08 ENCOUNTER — Ambulatory Visit: Payer: Medicaid Other

## 2021-11-08 DIAGNOSIS — O30043 Twin pregnancy, dichorionic/diamniotic, third trimester: Secondary | ICD-10-CM

## 2021-11-08 DIAGNOSIS — O365931 Maternal care for other known or suspected poor fetal growth, third trimester, fetus 1: Secondary | ICD-10-CM

## 2021-11-08 DIAGNOSIS — O30049 Twin pregnancy, dichorionic/diamniotic, unspecified trimester: Secondary | ICD-10-CM

## 2021-11-08 DIAGNOSIS — Z3A35 35 weeks gestation of pregnancy: Secondary | ICD-10-CM

## 2021-11-08 DIAGNOSIS — O09893 Supervision of other high risk pregnancies, third trimester: Secondary | ICD-10-CM

## 2021-11-08 DIAGNOSIS — O321XX2 Maternal care for breech presentation, fetus 2: Secondary | ICD-10-CM

## 2021-11-08 LAB — CBC
HCT: 26.6 % — ABNORMAL LOW (ref 36.0–46.0)
Hemoglobin: 8.5 g/dL — ABNORMAL LOW (ref 12.0–15.0)
MCH: 27.7 pg (ref 26.0–34.0)
MCHC: 32 g/dL (ref 30.0–36.0)
MCV: 86.6 fL (ref 80.0–100.0)
Platelets: 125 10*3/uL — ABNORMAL LOW (ref 150–400)
RBC: 3.07 MIL/uL — ABNORMAL LOW (ref 3.87–5.11)
RDW: 17.4 % — ABNORMAL HIGH (ref 11.5–15.5)
WBC: 11.1 10*3/uL — ABNORMAL HIGH (ref 4.0–10.5)
nRBC: 0.3 % — ABNORMAL HIGH (ref 0.0–0.2)

## 2021-11-08 LAB — GC/CHLAMYDIA PROBE AMP (~~LOC~~) NOT AT ARMC
Chlamydia: NEGATIVE
Comment: NEGATIVE
Comment: NORMAL
Neisseria Gonorrhea: NEGATIVE

## 2021-11-08 LAB — RUBELLA SCREEN: Rubella: 6.19 index (ref >0.99)

## 2021-11-08 LAB — RPR: RPR Ser Ql: NONREACTIVE

## 2021-11-08 SURGERY — Surgical Case
Anesthesia: Epidural | Wound class: Clean Contaminated

## 2021-11-08 MED ORDER — IBUPROFEN 600 MG PO TABS
600.0000 mg | ORAL_TABLET | Freq: Four times a day (QID) | ORAL | Status: DC
  Administered 2021-11-09 – 2021-11-10 (×7): 600 mg via ORAL
  Filled 2021-11-08 (×7): qty 1

## 2021-11-08 MED ORDER — TERBUTALINE SULFATE 1 MG/ML IJ SOLN
0.2500 mg | Freq: Once | INTRAMUSCULAR | Status: DC | PRN
Start: 2021-11-08 — End: 2021-11-08

## 2021-11-08 MED ORDER — SODIUM CHLORIDE 0.9 % IV SOLN: 5.0000 10*6.[IU] | Freq: Once | INTRAVENOUS | Status: DC

## 2021-11-08 MED ORDER — BENZOCAINE-MENTHOL 20-0.5 % EX AERO
1.0000 | INHALATION_SPRAY | CUTANEOUS | Status: DC | PRN
  Administered 2021-11-08: 1 via TOPICAL
  Filled 2021-11-08: qty 56

## 2021-11-08 MED ORDER — OXYTOCIN-SODIUM CHLORIDE 30-0.9 UT/500ML-% IV SOLN: 2.5000 [IU]/h | INTRAVENOUS | Status: DC | PRN

## 2021-11-08 MED ORDER — DIBUCAINE (PERIANAL) 1 % EX OINT: 1.0000 | TOPICAL_OINTMENT | CUTANEOUS | Status: DC | PRN

## 2021-11-08 MED ORDER — ACETAMINOPHEN 325 MG PO TABS
650.0000 mg | ORAL_TABLET | ORAL | Status: DC | PRN
  Administered 2021-11-08 – 2021-11-09 (×2): 650 mg via ORAL
  Filled 2021-11-08: qty 2

## 2021-11-08 MED ORDER — CEFAZOLIN SODIUM-DEXTROSE 2-3 GM-%(50ML) IV SOLR
INTRAVENOUS | Status: DC | PRN
  Administered 2021-11-08: 2 g via INTRAVENOUS

## 2021-11-08 MED ORDER — DIPHENHYDRAMINE HCL 25 MG PO CAPS
25.0000 mg | ORAL_CAPSULE | Freq: Four times a day (QID) | ORAL | Status: DC | PRN
  Administered 2021-11-08: 25 mg via ORAL
  Filled 2021-11-08: qty 1

## 2021-11-08 MED ORDER — HYDROXYZINE HCL 25 MG PO TABS
25.0000 mg | ORAL_TABLET | Freq: Three times a day (TID) | ORAL | Status: DC | PRN
  Administered 2021-11-08: 25 mg via ORAL
  Filled 2021-11-08: qty 1

## 2021-11-08 MED ORDER — ONDANSETRON HCL 4 MG/2ML IJ SOLN: 4.0000 mg | INTRAMUSCULAR | Status: DC | PRN

## 2021-11-08 MED ORDER — CEFAZOLIN SODIUM-DEXTROSE 2-4 GM/100ML-% IV SOLN
INTRAVENOUS | Status: AC
  Filled 2021-11-08: qty 100

## 2021-11-08 MED ORDER — METHYLERGONOVINE MALEATE 0.2 MG/ML IJ SOLN
INTRAMUSCULAR | Status: AC
  Filled 2021-11-08: qty 1

## 2021-11-08 MED ORDER — FENTANYL CITRATE (PF) 100 MCG/2ML IJ SOLN
INTRAMUSCULAR | Status: AC
  Filled 2021-11-08: qty 2

## 2021-11-08 MED ORDER — COCONUT OIL OIL: 1.0000 | TOPICAL_OIL | Status: DC | PRN

## 2021-11-08 MED ORDER — LIDOCAINE-EPINEPHRINE (PF) 2 %-1:200000 IJ SOLN
INTRAMUSCULAR | Status: DC | PRN
  Administered 2021-11-08: 5 mL via EPIDURAL
  Administered 2021-11-08: 3 mL via EPIDURAL

## 2021-11-08 MED ORDER — OXYTOCIN-SODIUM CHLORIDE 30-0.9 UT/500ML-% IV SOLN
INTRAVENOUS | Status: AC
  Filled 2021-11-08: qty 500

## 2021-11-08 MED ORDER — SIMETHICONE 80 MG PO CHEW
80.0000 mg | CHEWABLE_TABLET | ORAL | Status: DC | PRN
  Administered 2021-11-09: 80 mg via ORAL
  Filled 2021-11-08: qty 1

## 2021-11-08 MED ORDER — TETANUS-DIPHTH-ACELL PERTUSSIS 5-2.5-18.5 LF-MCG/0.5 IM SUSY: 0.5000 mL | PREFILLED_SYRINGE | Freq: Once | INTRAMUSCULAR | Status: DC

## 2021-11-08 MED ORDER — OXYCODONE-ACETAMINOPHEN 5-325 MG PO TABS: 2.0000 | ORAL_TABLET | ORAL | Status: DC | PRN

## 2021-11-08 MED ORDER — TRANEXAMIC ACID-NACL 1000-0.7 MG/100ML-% IV SOLN
INTRAVENOUS | Status: DC | PRN
  Administered 2021-11-08: 1000 mg via INTRAVENOUS

## 2021-11-08 MED ORDER — OXYTOCIN-SODIUM CHLORIDE 30-0.9 UT/500ML-% IV SOLN
1.0000 m[IU]/min | INTRAVENOUS | Status: DC
  Administered 2021-11-08: 1 m[IU]/min via INTRAVENOUS

## 2021-11-08 MED ORDER — ONDANSETRON HCL 4 MG/2ML IJ SOLN: 4.0000 mg | Freq: Four times a day (QID) | INTRAMUSCULAR | Status: DC | PRN

## 2021-11-08 MED ORDER — PENICILLIN G POT IN DEXTROSE 60000 UNIT/ML IV SOLN: 3.0000 10*6.[IU] | INTRAVENOUS | Status: DC

## 2021-11-08 MED ORDER — WITCH HAZEL-GLYCERIN EX PADS: 1.0000 | MEDICATED_PAD | CUTANEOUS | Status: DC | PRN

## 2021-11-08 MED ORDER — CEFAZOLIN IN SODIUM CHLORIDE 3-0.9 GM/100ML-% IV SOLN
INTRAVENOUS | Status: AC
  Filled 2021-11-08: qty 100

## 2021-11-08 MED ORDER — OXYTOCIN-SODIUM CHLORIDE 30-0.9 UT/500ML-% IV SOLN
INTRAVENOUS | Status: DC | PRN
  Administered 2021-11-08: 41.7 mL via INTRAVENOUS

## 2021-11-08 MED ORDER — PRENATAL MULTIVITAMIN CH
1.0000 | ORAL_TABLET | Freq: Every day | ORAL | Status: DC
  Administered 2021-11-09 – 2021-11-10 (×2): 1 via ORAL
  Filled 2021-11-08 (×2): qty 1

## 2021-11-08 MED ORDER — TRANEXAMIC ACID-NACL 1000-0.7 MG/100ML-% IV SOLN
INTRAVENOUS | Status: AC
  Filled 2021-11-08: qty 100

## 2021-11-08 MED ORDER — OXYTOCIN-SODIUM CHLORIDE 30-0.9 UT/500ML-% IV SOLN: 1.0000 m[IU]/min | INTRAVENOUS | Status: DC

## 2021-11-08 MED ORDER — METHYLERGONOVINE MALEATE 0.2 MG/ML IJ SOLN
INTRAMUSCULAR | Status: DC | PRN
  Administered 2021-11-08: .2 mg via INTRAMUSCULAR

## 2021-11-08 MED ORDER — ONDANSETRON HCL 4 MG PO TABS: 4.0000 mg | ORAL_TABLET | ORAL | Status: DC | PRN

## 2021-11-08 MED ORDER — OXYCODONE-ACETAMINOPHEN 5-325 MG PO TABS: 1.0000 | ORAL_TABLET | ORAL | Status: DC | PRN

## 2021-11-08 MED ORDER — NALBUPHINE HCL 10 MG/ML IJ SOLN: 5.0000 mg | INTRAMUSCULAR | Status: DC | PRN

## 2021-11-08 MED ORDER — FENTANYL CITRATE (PF) 100 MCG/2ML IJ SOLN
INTRAMUSCULAR | Status: DC | PRN
  Administered 2021-11-08: 100 ug via EPIDURAL

## 2021-11-08 MED ORDER — ACETAMINOPHEN 325 MG PO TABS
ORAL_TABLET | ORAL | Status: AC
  Filled 2021-11-08: qty 2

## 2021-11-08 MED ORDER — OXYCODONE HCL 5 MG PO TABS
5.0000 mg | ORAL_TABLET | Freq: Four times a day (QID) | ORAL | Status: DC | PRN
  Administered 2021-11-09 – 2021-11-10 (×5): 5 mg via ORAL
  Filled 2021-11-08 (×5): qty 1

## 2021-11-08 MED ORDER — SENNOSIDES-DOCUSATE SODIUM 8.6-50 MG PO TABS
2.0000 | ORAL_TABLET | Freq: Every evening | ORAL | Status: DC | PRN
Start: 2021-11-09 — End: 2021-11-10

## 2021-11-08 SURGICAL SUPPLY — 33 items
BENZOIN TINCTURE PRP APPL 2/3 (GAUZE/BANDAGES/DRESSINGS) ×1 IMPLANT
CANISTER SUCT 3000ML PPV (MISCELLANEOUS) ×1 IMPLANT
CHLORAPREP W/TINT 26ML (MISCELLANEOUS) ×2 IMPLANT
CLAMP UMBILICAL CORD (MISCELLANEOUS) ×1 IMPLANT
DRSG OPSITE POSTOP 4X10 (GAUZE/BANDAGES/DRESSINGS) ×1 IMPLANT
ELECT REM PT RETURN 9FT ADLT (ELECTROSURGICAL) ×1
ELECTRODE REM PT RTRN 9FT ADLT (ELECTROSURGICAL) ×1 IMPLANT
EXTRACTOR VACUUM KIWI (MISCELLANEOUS) ×1 IMPLANT
GLOVE BIOGEL PI IND STRL 7.0 (GLOVE) ×2 IMPLANT
GLOVE BIOGEL PI IND STRL 7.5 (GLOVE) ×1 IMPLANT
GLOVE BIOGEL PI INDICATOR 7.0 (GLOVE) ×2
GLOVE BIOGEL PI INDICATOR 7.5 (GLOVE) ×1
GLOVE SKINSENSE NS SZ7.0 (GLOVE) ×1
GLOVE SKINSENSE STRL SZ7.0 (GLOVE) ×1 IMPLANT
GOWN STRL REUS W/ TWL LRG LVL3 (GOWN DISPOSABLE) ×2 IMPLANT
GOWN STRL REUS W/ TWL XL LVL3 (GOWN DISPOSABLE) ×1 IMPLANT
GOWN STRL REUS W/TWL LRG LVL3 (GOWN DISPOSABLE) ×2
GOWN STRL REUS W/TWL XL LVL3 (GOWN DISPOSABLE) ×1
NS IRRIG 1000ML POUR BTL (IV SOLUTION) ×1 IMPLANT
PACK C SECTION WH (CUSTOM PROCEDURE TRAY) ×1 IMPLANT
PAD ABD 7.5X8 STRL (GAUZE/BANDAGES/DRESSINGS) ×1 IMPLANT
PAD OB MATERNITY 4.3X12.25 (PERSONAL CARE ITEMS) ×1 IMPLANT
PAD PREP 24X48 CUFFED NSTRL (MISCELLANEOUS) ×1 IMPLANT
STRIP CLOSURE SKIN 1/2X4 (GAUZE/BANDAGES/DRESSINGS) ×1 IMPLANT
SUT MNCRL 0 VIOLET CTX 36 (SUTURE) ×2 IMPLANT
SUT MON AB 4-0 PS1 27 (SUTURE) ×1 IMPLANT
SUT MONOCRYL 0 CTX 36 (SUTURE) ×2
SUT PLAIN 2 0 XLH (SUTURE) ×1 IMPLANT
SUT VIC AB 0 CT1 36 (SUTURE) ×2 IMPLANT
SUT VIC AB 3-0 CT1 27 (SUTURE) ×1
SUT VIC AB 3-0 CT1 TAPERPNT 27 (SUTURE) ×1 IMPLANT
TOWEL OR 17X24 6PK STRL BLUE (TOWEL DISPOSABLE) ×2 IMPLANT
WATER STERILE IRR 1000ML POUR (IV SOLUTION) ×1 IMPLANT

## 2021-11-08 NOTE — Progress Notes (Signed)
L&D Note  11/08/2021 - 8:45 AM  26 y.o. [id number] [redacted]w[redacted]d. Pregnancy complicated by Di-Di twins, short interval pregnancy, limited [facility]  Patient Active Problem List   Diagnosis Date Noted   Limited prenatal care 11/07/2021   History of behavioral and mental health problems 11/07/2021   Preterm labor in third trimester without delivery 11/07/2021   Dichorionic diamniotic twin gestation 07/19/2021   Supervision of high risk pregnancy, antepartum 07/10/2021   UTI (urinary tract infection) during pregnancy 10/11/2018    [name] is admitted for preterm labor   Subjective:  Pt resting comfortably  Objective:    Current Vital Signs 24h Vital Sign Ranges  T (!) 97.4 F (36.3 C) Temp  Avg: 97.9 F (36.6 C)  Min: 97.4 F (36.3 C)  Max: 98.5 F (36.9 C)  BP 118/68 BP  Min: 81/59  Max: 139/63  HR 74 Pulse  Avg: 83.4  Min: 64  Max: 126  RR 17 Resp  Avg: 16.8  Min: 16  Max: 18  SaO2 100 %   SpO2  Avg: 99 %  Min: 98 %  Max: 100 %       24 Hour I/O Current Shift I/O  Time Ins Outs 08/23 0701 - 08/24 0700 In: 0  Out: 825 [Urine:825] No intake/output data recorded.   FHR:  A 125 baseline, +accels, no decel, mod variability B 135 baseline, +accels, no decel, mod variability Toco: q70m Gen: NAD SVE: deferred  Labs:  Recent Labs  Lab 11/07/21 1031  WBC 9.8  HGB 9.5*  HCT 29.6*  PLT 134*   B POS  Medications Current Facility-Administered Medications  Medication Dose Route Frequency Provider Last Rate Last Admin   acetaminophen (TYLENOL) tablet 650 mg  650 mg Oral Q4H PRN Leftwich-Kirby, Lisa A, CNM       ampicillin (OMNIPEN) 1 g in sodium chloride 0.9 % 100 mL IVPB  1 g Intravenous Q4H Wouk, Wilfred Curtis, MD 300 mL/hr at 11/08/21 0541 1 g at 11/08/21 0541   ePHEDrine injection 10 mg  10 mg Intravenous PRN Hodierne, Madelaine Bhat, MD       ePHEDrine injection 10 mg  10 mg Intravenous PRN Hodierne, Madelaine Bhat, MD       fentaNYL (SUBLIMAZE) injection 100 mcg  100 mcg Intravenous Q1H PRN  Sandra Cockayne M, CNM   100 mcg at 11/07/21 1054   fentaNYL 2 mcg/mL w/ bupivacaine 0.125% in NS 250 mL epidural infusion  12 mL/hr Epidural Continuous PRN Achille Rich, MD 12 mL/hr at 11/07/21 1156 12 mL/hr at 11/07/21 1156   lactated ringers infusion 500 mL  500 mL Intravenous Once Hodierne, Adam, MD       lactated ringers infusion 500 mL  500 mL Intravenous Once Hodierne, Adam, MD       lactated ringers infusion 500-1,000 mL  500-1,000 mL Intravenous PRN Leftwich-Kirby, Wilmer Floor, CNM 999 mL/hr at 11/07/21 2235 500 mL at 11/07/21 2235   lactated ringers infusion   Intravenous Continuous Leftwich-Kirby, Lisa A, CNM 125 mL/hr at 11/07/21 2305 Rate Change at 11/07/21 2305   lidocaine (PF) (XYLOCAINE) 1 % injection 30 mL  30 mL Subcutaneous PRN Leftwich-Kirby, Lisa A, CNM       ondansetron (ZOFRAN) injection 4 mg  4 mg Intravenous Q6H Hodierne, Adam, MD   4 mg at 11/07/21 1753   oxytocin (PITOCIN) IV BOLUS FROM BAG  333 mL Intravenous Once Leftwich-Kirby, Lisa A, CNM       oxytocin (PITOCIN) IV infusion 30 units  in NS 500 mL - Premix  2.5 Units/hr Intravenous Continuous Leftwich-Kirby, Lisa A, CNM       oxytocin (PITOCIN) IV infusion 30 units in NS 500 mL - Premix  1-40 milli-units/min Intravenous Titrated Adam Phenix, MD 2 mL/hr at 11/08/21 0813 2 milli-units/min at 11/08/21 0813   Oxytocin-Sodium Chloride 30-0.9 UT/500ML-% infusion            PHENYLephrine 80 mcg/ml in normal saline Adult IV Push Syringe (For Blood Pressure Support)  80 mcg Intravenous PRN Hodierne, Madelaine Bhat, MD       PHENYLephrine 80 mcg/ml in normal saline Adult IV Push Syringe (For Blood Pressure Support)  80 mcg Intravenous PRN Hodierne, Adam, MD       sodium citrate-citric acid (ORACIT) solution 30 mL  30 mL Oral Q2H PRN Leftwich-Kirby, Lisa A, CNM       terbutaline (BRETHINE) injection 0.25 mg  0.25 mg Subcutaneous Once PRN Adam Phenix, MD       terbutaline (BRETHINE) injection 0.25 mg  0.25 mg Subcutaneous Once PRN  Mercado-Ortiz, Jessica Q, DO       Facility-Administered Medications Ordered in Other Encounters  Medication Dose Route Frequency Provider Last Rate Last Admin   lidocaine (PF) (XYLOCAINE) 1 % injection   Epidural Anesthesia Intra-op Achille Rich, MD   5 mL at 11/07/21 1150    Assessment & Plan:  Pt doing well *Pregnancy: category I tracing with accels for both babies *Labor: continue pitocin augmentation per protocol. I d/w pt that will check in a few hours and likely recommend AROM then, which she is amenable to *Twins: I told her I like to do twin deliveries in the OR, especially in cases like hers where Twin B is not cephalic (B was transverse on admit). I d/w her re: risk of head entrapment for twin B, need for stat c-section, risk to mother and baby and she would like to proceed with trying for vaginal delivery. She states that if she needs a c/s she would like a BTL. I told her to consider it permanent and that I would only do it if both babies are doing well 8/11: discordance 12% A 1817gm, 3.1%, normal UA dopplers, cephalic B 2065gm, 15%, normal UA dopplers, breech *GBS: unknown. Continue amp *Analgesia: epidural working well  Cornelia Copa. MD Attending Center for [employer] [facility])

## 2021-11-08 NOTE — Discharge Summary (Addendum)
Postpartum Discharge Summary  Date of Service updated 11/10/21    Patient Name: Kristin Brock DOB: [birth year] MRN: [id number]  Date of admission: 11/07/2021 Delivery date:   Blima, Jaimes [[id number]]  11/08/2021    Datra, Clary [[id number]]  11/08/2021 Delivering provider:    Sundi, Slevin [[id number]]  Jayliani, Wanner [[id number]]  Aletha Halim Date of discharge: 11/10/2021  Admitting diagnosis: Preterm labor in third trimester without delivery [O60.03] Intrauterine pregnancy: [redacted]w[redacted]d    Secondary diagnosis:  Principal Problem:   Preterm labor in third trimester without delivery Active Problems:   Limited prenatal care   History of behavioral and mental health problems   Short interval between pregnancies affecting pregnancy in third trimester, antepartum   PPH (postpartum hemorrhage)  Additional problems: n/a    Discharge diagnosis: Preterm Pregnancy [place]                                             Post partum procedures: [name] placement  Augmentation: AROM and Pitocin Complications: [id number] Hospital course: Onset of Labor With Vaginal Delivery      26y.o. yo [id number] admitted in Active Labor on 11/07/2021. Patient had an uncomplicated labor course as follows:  Membrane Rupture Time/Date:    KeBritanny, Marksberry0[[id number]]12:19 PM    KeDyesha, Henault0[[id number]]4:43 PM ,   KeMarlon, Vonruden0[[id number]]11/08/2021    KeAlphia, Behanna0[[id number]]11/08/2021   Delivery Method:   KeBosie Clos0[[id number]]Vaginal, Spontaneous    KeDesirre, Eickhoff0[[id number]]Vaginal, Breech Episiotomy:    KeTymia, Streb0[[id number]]None    KeLatitia, Housewright0[[id number]]None Lacerations:     KeLeonda, Cristo0[[id number]]1st degree;Periurethral    KeAveena, Bari0[[id number]]1st degree;Periurethral   [facility]  [facility] details.  Patient had an uncomplicated postpartum course.  She is ambulating, tolerating a regular diet, passing flatus, and urinating well. Patient is discharged home in stable condition on 11/10/21.  Newborn Data: Birth date:   KeIyonna, Rish0[[id number]]11/08/2021    KeCarlita, Whitcomb0[[id number]]11/08/2021 Birth time:   KeMayzee, Reichenbach0[[id number]]4:39 PM    KeLenox, Ladouceur0[[id number]]4:45 PM Gender:   KeYuritzi, Kamp0[[id number]]Female    KeKelsee, PreslareEldred0[[id number]]Female Living status:   KeSylver, Vantassell0[[id number]]Living    [address][[id number]]Living Apgars:   KeBryttani, Blew0[[id number]]8 [address]0[[id number]]3 [place]   KeIran, Kievit0[[id number]]9 [address]0[[id number]]8 Weight:   KeGiovana, Faciane0[[id number]]2270 g    KeDannell, Raczkowski0[[id number]]2240 g   Magnesium Sulfate received: No BMZ received: No Rhophylac:N/A MMR:N/A T-DaP: declined Flu: N/A Transfusion:No  Physical exam  Vitals:   11/09/21 0400 11/09/21 0935 11/09/21 2108 11/10/21 0450  BP: 112/68 (!) 110/58 113/67 118/83  Pulse: 76 71 71 65  Resp: _0 Temp: (!) 97.5 F (36.4 C) 98.1 F (36.7 C) 98 F (36.7 C) 97.9 F (36.6 C)  TempSrc:  Oral Oral Oral Axillary  SpO2: 100% 100% 100%   Weight:      Height:       General: alert, cooperative, and no distress Lochia: appropriate Uterine Fundus: firm Incision: N/A DVT Evaluation: No evidence of DVT seen on physical exam. Labs: Lab Results  Component Value Date   WBC 10.8 (H) 11/09/2021   HGB 7.1 (L) 11/09/2021   HCT 21.8 (L) 11/09/2021   MCV 86.5 11/09/2021   PLT 116 (L) 11/09/2021      Latest Ref Rng & Units 02/26/2020    8:07 AM  CMP  Glucose 70 - 99 mg/dL 108   BUN 6 - 20 mg/dL 4   Creatinine 0.44 - 1.00 mg/dL 1.00   Sodium 135 - 145 mmol/L 143   Potassium 3.5 - 5.1 mmol/L 4.0   Chloride 98 - 111 mmol/L 109     Edinburgh Score:    11/09/2021    3:17 PM  Edinburgh Postnatal Depression Scale Screening Tool  I have been able to laugh and see the funny side of things. 0  I have looked forward with enjoyment to things. 0  I have blamed myself unnecessarily when things went wrong. 3  I have been anxious or worried for no good reason. 1  I have felt scared or panicky for no good reason. 1  Things have been getting on top of me. 1  I have been so unhappy that I have had difficulty sleeping. 0  I have felt sad or miserable. 0  I have been so unhappy that I have been crying. 0  The thought of harming myself has occurred to me. 0  Edinburgh Postnatal Depression Scale Total 6     After visit meds:  Allergies as of 11/10/2021   No Known Allergies      Medication List     TAKE these medications    acetaminophen 500 MG tablet Commonly known as: TYLENOL Take 500 mg by mouth every 6 (six) hours as needed for mild pain. What changed: Another medication with the same name was added. Make sure you understand how and when to take each.   acetaminophen 325 MG tablet Commonly known as: Tylenol Take 2 tablets (650 mg total) by mouth every 4 (four) hours as needed (for pain scale < 4). What changed: You were already taking a medication with the same name, and this prescription was added. Make sure you understand how and when to take each.   benzocaine-Menthol 20-0.5 % Aero Commonly known as: DERMOPLAST Apply 1 Application topically as needed for irritation (perineal discomfort).   Blood Pressure Monitoring Devi 1 each by Does not apply route once a week.   calcium carbonate 500 MG chewable tablet Commonly known as: TUMS - dosed in mg elemental calcium Chew 1,000 mg by mouth daily as needed for indigestion or heartburn.   COLD & COUGH DAYTIME PO Take 1 tablet by mouth every 6 (six) hours as needed (cold symptoms).   ibuprofen 600 MG tablet Commonly known as: ADVIL Take 1 tablet (600 mg  total) by mouth every 6 (six) hours.   Prenatal Complete 14-0.4 MG Tabs Take 1 tablet by mouth daily.   senna-docusate 8.6-50 MG tablet Commonly known as: Senokot-S Take 2 tablets by mouth at bedtime as needed for mild constipation.         Discharge home in stable condition Infant Feeding: Bottle Infant Disposition:home with mother Discharge instruction: per After Visit Summary and Postpartum booklet. Activity: Advance as tolerated.  Pelvic rest for 6 weeks.  Diet: routine diet Future Appointments: Future Appointments  Date Time Provider [place]  12/03/2021  3:35 PM Griffin Basil, MD [facility]  12/18/2021  2:15 PM Caron Presume, Angelyn Punt, MD [facility]   Follow up Visit:  Message sent   Please schedule this patient for a In person postpartum visit in 6 weeks with the following provider: Any provider. Additional Postpartum F/U: None    High risk pregnancy complicated by:  IUGR, Didi twins, short interval pregnancy  Delivery mode:     Detria, Cummings [[id number]]  Vaginal, Spontaneous    Damian, Hofstra [[id number]]  Vaginal, Breech Anticipated Birth Control:  Depo and interval tubal ligation    11/10/2021 Shelda Pal, DO

## 2021-11-08 NOTE — Progress Notes (Signed)
        Kristin Brock accompanied Kristin Brock to the Labor & Delivery on 11/07/2021-11/08/21. They may return to work on 11/09/21.   Treatment Team:     Janene Madeira, MD [phone]

## 2021-11-08 NOTE — Progress Notes (Signed)
LABOR PROGRESS NOTE  Kristin Brock is a 26 y.o. [id number] at [redacted]w[redacted]d  admitted for SOL with Didi twins, IUGR baby A.   Subjective: Comfortable with epidural in place.   Objective: BP (!) 102/52   Pulse 89   Temp 97.9 F (36.6 C) (Oral)   Resp 17   Ht 5\' 3"  (1.6 m)   Wt 86.7 kg   LMP 02/26/2021   SpO2 100%   BMI 33.85 kg/m  or  Vitals:   11/08/21 1130 11/08/21 1200 11/08/21 1230 11/08/21 1300  BP: 121/71 104/68 110/61 (!) 102/52  Pulse: 97 81 91 89  Resp:      Temp:  97.9 F (36.6 C)    TempSrc:  Oral    SpO2:      Weight:      Height:         Dilation: 6 Effacement (%): 80 Station: -2 Presentation: Vertex Exam by:: Crezenso Baby A FHT: baseline rate 130, moderate varibility, + acel, no decel Toco: every 4-5 min  Baby B FHT: baseline rate 135, moderate varibility, + acel, no decel Labs: Lab Results  Component Value Date   WBC 9.8 11/07/2021   HGB 9.5 (L) 11/07/2021   HCT 29.6 (L) 11/07/2021   MCV 86.8 11/07/2021   PLT 134 (L) 11/07/2021    Patient Active Problem List   Diagnosis Date Noted   Short interval between pregnancies affecting pregnancy in third trimester, antepartum 11/08/2021   Limited prenatal care 11/07/2021   History of behavioral and mental health problems 11/07/2021   Preterm labor in third trimester without delivery 11/07/2021   Dichorionic diamniotic twin gestation 07/19/2021   Supervision of high risk pregnancy, antepartum 07/10/2021   UTI (urinary tract infection) during pregnancy 10/11/2018    Assessment / Plan: 26 y.o. 25 at [redacted]w[redacted]d here for SOL   Labor: AROM of baby A at this check with meconium stained fluid. Tolerated well. Plan for a few practice pushes in the room prior to going to the OR of delivery.  Fetal Wellbeing:  Cat 1, Cat 1 Pain Control:  epidural in place  Anticipated MOD:  vaginal  GBS unknown - Ampicillin given   [redacted]w[redacted]d, MD  11/08/2021, 1:28 PM

## 2021-11-08 NOTE — Transfer of Care (Signed)
Immediate Anesthesia Transfer of Care Note  Patient: Kristin Brock  Procedure(s) Performed: VAGINAL DELIVERY Twins  Patient Location: Labor and delivery, Report to Dema Severin and D RN  Anesthesia Type:Epidural  Level of Consciousness: awake, alert  and oriented  Airway & Oxygen Therapy: Patient Spontanous Breathing  Post-op Assessment: Report given to RN and Post -op Vital signs reviewed and stable  Post vital signs: Reviewed and stable HR 78, RR 20, BP 128/71, SaO2 98%  Last Vitals:  Vitals Value Taken Time  BP    Temp    Pulse    Resp    SpO2      Last Pain:  Vitals:   11/08/21 1606  TempSrc:   PainSc: 6       Patients Stated Pain Goal: 2 (11/08/21 1606)  Complications: No notable events documented.

## 2021-11-08 NOTE — Anesthesia Postprocedure Evaluation (Signed)
Anesthesia Post Note  Patient: Kristin Brock  Procedure(s) Performed: VAGINAL DELIVERY Twins     Patient location during evaluation: PACU Anesthesia Type: Epidural Level of consciousness: awake and alert and oriented Pain management: pain level controlled Vital Signs Assessment: post-procedure vital signs reviewed and stable Respiratory status: spontaneous breathing, nonlabored ventilation and respiratory function stable Cardiovascular status: blood pressure returned to baseline and stable Postop Assessment: no headache, no backache and epidural receding Anesthetic complications: no Comments: [name] in place of PPH, transferred back to labor and delivery room    No notable events documented.  Last Vitals:  Vitals:   11/08/21 1430 11/08/21 1500  BP: 102/62 (!) 103/44  Pulse: 74 79  Resp:    Temp:    SpO2:      Last Pain:  Vitals:   11/08/21 1606  TempSrc:   PainSc: 6    Pain Goal: Patients Stated Pain Goal: 2 (11/08/21 1606)                 Lannie Fields

## 2021-11-09 LAB — CBC
HCT: 21.8 % — ABNORMAL LOW (ref 36.0–46.0)
Hemoglobin: 7.1 g/dL — ABNORMAL LOW (ref 12.0–15.0)
MCH: 28.2 pg (ref 26.0–34.0)
MCHC: 32.6 g/dL (ref 30.0–36.0)
MCV: 86.5 fL (ref 80.0–100.0)
Platelets: 116 10*3/uL — ABNORMAL LOW (ref 150–400)
RBC: 2.52 MIL/uL — ABNORMAL LOW (ref 3.87–5.11)
RDW: 17.3 % — ABNORMAL HIGH (ref 11.5–15.5)
WBC: 10.8 10*3/uL — ABNORMAL HIGH (ref 4.0–10.5)
nRBC: 0.2 % (ref 0.0–0.2)

## 2021-11-09 MED ORDER — MEDROXYPROGESTERONE ACETATE 150 MG/ML IM SUSP
150.0000 mg | Freq: Once | INTRAMUSCULAR | Status: DC
  Filled 2021-11-09: qty 1

## 2021-11-09 MED ORDER — SODIUM CHLORIDE 0.9 % IV SOLN
500.0000 mg | Freq: Once | INTRAVENOUS | Status: AC
  Administered 2021-11-09: 500 mg via INTRAVENOUS
  Filled 2021-11-09: qty 25

## 2021-11-09 NOTE — Progress Notes (Signed)
Post Partum Day 1 Subjective: no complaints, up ad lib, voiding, tolerating PO, and + flatus  Objective: Blood pressure (!) 110/58, pulse 71, temperature 98.1 F (36.7 C), temperature source Oral, resp. rate 18, height 5\' 3"  (1.6 m), weight 86.7 kg, last menstrual period 02/26/2021, SpO2 100 %, unknown if currently breastfeeding.  Physical Exam:  General: alert, cooperative, and no distress Lochia: appropriate Uterine Fundus: firm Incision: NA DVT Evaluation: No evidence of DVT seen on physical exam.  Recent Labs    11/08/21 2009 11/09/21 0516  HGB 8.5* 7.1*  HCT 26.6* 21.8*    Assessment/Plan: Plan for discharge tomorrow, Circumcision prior to discharge, and Contraception depo prior to DC Wants BTL, but will need to come to the office to sign papers. - message sent to schedule her ASAP to come sign papers.  IV Venofer today    LOS: 2 days   11/11/21 DNP, CNM  11/09/21  11:12 AM

## 2021-11-09 NOTE — Social Work (Signed)
CSW made aware that MOB's older children are in foster care. CSW attempted to make CPS report, awaiting call back.  Barriers to discharge  Elizibeth Breau, LCSWA Clinical Social Worker [phone]  

## 2021-11-09 NOTE — Social Work (Signed)
CSW made CPS report to [place] CPS worker Connie Pass.  Barriers to discharge  Sarahanne Novakowski, LCSWA Clinical Social Worker [phone] 

## 2021-11-09 NOTE — Progress Notes (Signed)
IV infiltrated during iron transfusion. Patient received out of .

## 2021-11-09 NOTE — Clinical Social Work Maternal (Signed)
CLINICAL SOCIAL WORK MATERNAL/CHILD NOTE   Patient Details  Name: Kristin Brock MRN: [id number] Date of Birth: 05/06/1995   Date:  11/09/2021   Clinical Social Worker Initiating Note:  Geovana Gebel, LCSWA   Date/Time: Initiated:  11/09/21/1206              Child's Name:  Kristin Brock, Kristin Brock    Biological Parents:  Mother, Father (Kristin Brock 12/18/1997, Kristin Brock 10/23/1969)    Need for Interpreter:  None    Reason for Referral:  Late or No Prenatal Care      Address:  [address] [place] [address]    Phone number:  [phone] (home)      Additional phone number:    Household Members/Support Persons (HM/SP):   Household Member/Support Person 1, Household Member/Support Person 2     HM/SP Name Relationship DOB or Age  HM/SP -1 Kristin Brock Significant other 10/23/1969  HM/SP -2 Adora Grandmother   HM/SP -3     HM/SP -4     HM/SP -5     HM/SP -6     HM/SP -7     HM/SP -8         Natural Supports (not living in the home):  Children    Professional Supports:      Employment: Full-time    Type of Work: [employer]    Education:  9 to 11 years    Homebound arranged: No   Financial Resources:  Medicaid    Other Resources:  Food Stamps  , WIC    Cultural/Religious Considerations Which May Impact Care:     Strengths:  Compliance with medical plan  , Home prepared for child  , Pediatrician chosen    Psychotropic Medications:          Pediatrician:    [place]   Pediatrician List:     Other ([facility])  [place]   [place]   [place]   [place]   [place]       Pediatrician Fax Number:     Risk Factors/Current Problems:  Basic Needs      Cognitive State:  Alert  , Able to Concentrate      Mood/Affect:  Calm  , Happy      CSW Assessment: CSW received consult for limited prenatal care and hx of Anxiety and Depression. CSW met with MOB  to offer support and complete assessment. CSW entered the room, introduced self, CSW role and reason for visit. MOB was agreeable to visit. CSW observed MOB up in bed and both infants in the their bedside bassinets sleeping. CSW inquired about how MOB was feeling, MOB stated " tired, sore, happy, blessed". CSW congratulated MOB on the birth of her twins. CSW inquired about MOB's pregnancy and her mood, MOB reported she was tired because she worked during her whole pregnancy and being up on her feet was tiring. CSW acknowledged MOB's feeling about her pregnancy. CSW inquired about MOB's MH history. 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 Reported she  was hospitalized due to SI. CSW assessed for safety MOB denied any current SI, HI or DV. MOB reported she is in a better place mentally. CSW inquired about treatment. MOB reported she was taking medication when she was hospitalized but none since then, MOB reported no therapy since then. CSW encouraged MOB to go to [facility] if MH needs arise. MOB agreed. MOB reported   she is not interested in counseling at this time, CSW provided resource list for future reference. CSW inquired about MOB's lack of prenatal care. MOB explained there was a lack of transportation. Explained that she works at [employer] and it was too far for her to travel after work and take care of her other children. CSW inquired if transportation was going to be an issue for infants follow up appointments, MOB stated "no". CSW explained Hospital Drug Screen Policy, MOB voiced understanding. MOB reported no current CPS history. Prior CPS case was because of a dispute between her and  FOB, reported case was closed. MOB reported her other children are still in her custody they just live with a family member in the area by choice. MOB reported CPS was not involved in the decision.    CSW provided education regarding the baby blues period vs. perinatal mood disorders, discussed  treatment and gave resources for mental health follow up if concerns arise.  CSW recommends self-evaluation during the postpartum time period using the New Mom Checklist from Postpartum Progress and encouraged MOB to contact a medical professional if symptoms are noted at any time.     CSW provided review of Sudden Infant Death Syndrome (SIDS) precautions. CSW inquired I thry had all necessary items for the infant. MOB reported that have everything expect carseats. CSW inquired if MOB had $60 to pay for 2 carseats,  MOB stated no. CSW inquired if there was a way to get $60 MOB reported that FOB has no money and she had no one else she could ask. CSW will provide infant car seats to MOB free of charge.    CSW Plan/Description:  Perinatal Mood and Anxiety Disorder (PMADs) Education, Sudden Infant Death Syndrome (SIDS) Education, CSW Will Continue to Monitor Umbilical Cord Tissue Drug Screen Results and Make Report if Warranted, Hospital Drug Screen Policy Information, No Further Intervention Required/No Barriers to Discharge, Other Information/Referral to Community Resources      [place] Audia M Evyn Putzier, LCSW 11/09/2021, 12:13 PM 

## 2021-11-10 LAB — TYPE AND SCREEN
ABO/RH(D): B POS
Antibody Screen: POSITIVE
Unit division: 0
Unit division: 0

## 2021-11-10 LAB — BPAM RBC
Blood Product Expiration Date: 202309212359
Blood Product Expiration Date: 202309212359
Unit Type and Rh: 7300
Unit Type and Rh: 7300

## 2021-11-10 LAB — CULTURE, BETA STREP (GROUP B ONLY)

## 2021-11-10 MED ORDER — ACETAMINOPHEN 325 MG PO TABS
650.0000 mg | ORAL_TABLET | ORAL | 0 refills | Status: AC | PRN
  Filled 2021-11-10 – 2021-11-16 (×2): qty 30, 3d supply, fill #0

## 2021-11-10 MED ORDER — SENNOSIDES-DOCUSATE SODIUM 8.6-50 MG PO TABS
2.0000 | ORAL_TABLET | Freq: Every evening | ORAL | 0 refills | Status: DC | PRN
  Filled 2021-11-10: qty 30, 15d supply, fill #0

## 2021-11-10 MED ORDER — BENZOCAINE-MENTHOL 20-0.5 % EX AERO
1.0000 | INHALATION_SPRAY | CUTANEOUS | 0 refills | Status: AC | PRN
  Filled 2021-11-10: qty 78, fill #0

## 2021-11-10 MED ORDER — IBUPROFEN 600 MG PO TABS
600.0000 mg | ORAL_TABLET | Freq: Four times a day (QID) | ORAL | 0 refills | Status: AC
  Filled 2021-11-10 – 2021-12-05 (×3): qty 30, 8d supply, fill #0

## 2021-11-10 NOTE — Progress Notes (Signed)
CPS case worker Connie Pass met with MOB and FOB at bedside. Per Connie Pass, barriers to both infant twins discharge remain in place. CPS will meet with MOB Monday morning 11/12/21 to determine custody of both infants. Per CPS, MOB can continue to visit with infants unsupervised while infants remain in hospital. Connie Pass communicated visitation rights to MOB and FOB during interaction on 11/10/21.   Barriers to infant's discharge.   Signed,  Kristin Brock, MSW, LCSWA, LCASA 11/10/2021 2:53 PM 

## 2021-11-12 ENCOUNTER — Other Ambulatory Visit (HOSPITAL_COMMUNITY): Payer: Self-pay

## 2021-11-12 LAB — SURGICAL PATHOLOGY

## 2021-11-16 ENCOUNTER — Telehealth (HOSPITAL_COMMUNITY): Payer: Self-pay | Admitting: *Deleted

## 2021-11-16 ENCOUNTER — Ambulatory Visit: Payer: Medicaid Other

## 2021-11-16 ENCOUNTER — Other Ambulatory Visit: Payer: Self-pay

## 2021-11-16 NOTE — Telephone Encounter (Signed)
Call can't be completed per number.  Duffy Rhody, [place] 11-16-2021 at 9:59am

## 2021-11-19 ENCOUNTER — Other Ambulatory Visit: Payer: Self-pay

## 2021-11-20 ENCOUNTER — Other Ambulatory Visit: Payer: Self-pay

## 2021-11-26 ENCOUNTER — Telehealth: Payer: Self-pay | Admitting: Family Medicine

## 2021-11-26 NOTE — Telephone Encounter (Signed)
Patient called in stating she just had twins and needs to go back to work. She works for [employer] as a Production assistant, radio and wants a letter to go back.

## 2021-11-28 ENCOUNTER — Encounter: Payer: Self-pay | Admitting: *Deleted

## 2021-11-28 NOTE — Telephone Encounter (Signed)
Discussed patient request and history with Dr. Para March.( Vaginal delivery 11/08/21 twins) Approved may return to work if patient feels ready and letter may be prepared.  I called Javaria and informed her we got her message. I asked if she felt ready to return to work . She states she does feel ready. I explained I discussed with a provider and it was approved. I explained I will prepare a letter and it will be in MyChart today. I explained we still recommend she attend her already schedule visits for preop and postpartum. She voices understanding. Nancy Fetter

## 2021-12-03 ENCOUNTER — Ambulatory Visit: Payer: Medicaid Other | Admitting: Obstetrics and Gynecology

## 2021-12-05 ENCOUNTER — Other Ambulatory Visit: Payer: Self-pay

## 2021-12-18 ENCOUNTER — Encounter: Payer: Self-pay | Admitting: Obstetrics and Gynecology

## 2021-12-18 ENCOUNTER — Ambulatory Visit: Payer: Medicaid Other | Admitting: Family Medicine

## 2021-12-18 ENCOUNTER — Ambulatory Visit (INDEPENDENT_AMBULATORY_CARE_PROVIDER_SITE_OTHER): Payer: Medicaid Other | Admitting: Obstetrics and Gynecology

## 2021-12-18 DIAGNOSIS — Z30011 Encounter for initial prescription of contraceptive pills: Secondary | ICD-10-CM | POA: Diagnosis not present

## 2021-12-18 DIAGNOSIS — Z309 Encounter for contraceptive management, unspecified: Secondary | ICD-10-CM | POA: Insufficient documentation

## 2021-12-18 MED ORDER — NORETHIN ACE-ETH ESTRAD-FE 1-20 MG-MCG(24) PO TABS: 1.0000 | ORAL_TABLET | Freq: Every day | ORAL | 11 refills | Status: AC

## 2021-12-18 NOTE — Patient Instructions (Signed)

## 2021-12-18 NOTE — Progress Notes (Deleted)
    [place] Partum Visit Note  Kristin Brock is a 26 y.o. [phone] female who presents for a postpartum visit. She is 5 weeks postpartum following a normal spontaneous vaginal delivery.  I have fully reviewed the prenatal and intrapartum course. The delivery was at 35/6 gestational weeks.  Anesthesia: epidural. Postpartum course has been ***. Baby is doing well***. Baby is feeding by {breastmilk/bottle:69}. Bleeding {vag bleed:12292}. Bowel function is {normal:32111}. Bladder function is {normal:32111}. Patient {is/is not:9024} sexually active. Contraception method is {contraceptive method:5051}. Postpartum depression screening: {gen negative/positive:315881}.   The pregnancy intention screening data noted above was reviewed. Potential methods of contraception were discussed. The patient elected to proceed with No data recorded.    Health Maintenance Due  Topic Date Due   HPV VACCINES (1 - 2-dose series) Never done   COVID-19 Vaccine (3 - Pfizer series) 10/28/2019   PAP-Cervical Cytology Screening  10/06/2021   PAP SMEAR-Modifier  10/06/2021   INFLUENZA VACCINE  Never done    {Common ambulatory SmartLinks:19316}  Review of Systems {ros; complete:30496}  Objective:  LMP 02/26/2021    General:  {gen appearance:16600}   Breasts:  {desc; normal/abnormal/not indicated:14647}  Lungs: {lung exam:16931}  Heart:  {heart exam:5510}  Abdomen: {abdomen exam:16834}   Wound {Wound assessment:11097}  GU exam:  {desc; normal/abnormal/not indicated:14647}       Assessment:    There are no diagnoses linked to this encounter.  *** postpartum exam.   Plan:   Essential components of care per ACOG recommendations:  1.  Mood and well being: Patient with {gen negative/positive:315881} depression screening today. Reviewed local resources for support.  - Patient tobacco use? {tobacco use:25506}  - hx of drug use? {yes/no:25505}    2. Infant care and feeding:  -Patient currently breastmilk  feeding? {yes/no:25502}  -Social determinants of health (SDOH) reviewed in EPIC. No concerns***The following needs were identified***  3. Sexuality, contraception and birth spacing - Patient {DOES_DOES BTD:17616} want a pregnancy in the next year.  Desired family size is {NUMBER 1-10:22536} children.  - Reviewed reproductive life planning. Reviewed contraceptive methods based on pt preferences and effectiveness.  Patient desired {Upstream End Methods:24109} today.   - Discussed birth spacing of 18 months  4. Sleep and fatigue -Encouraged family/partner/community support of 4 hrs of uninterrupted sleep to help with mood and fatigue  5. Physical Recovery  - Discussed patients delivery and complications. She describes her labor as {description:25511} - Patient had a {CHL AMB DELIVERY:4135234753}. Patient had a {laceration:25518} laceration. Perineal healing reviewed. Patient expressed understanding - Patient has urinary incontinence? {yes/no:25515} - Patient {ACTION; IS/IS WVP:71062694} safe to resume physical and sexual activity  6.  Health Maintenance - HM due items addressed {Yes or If no, why not?:20788} - Last pap smear  Diagnosis  Date Value Ref Range Status  10/07/2018   Final   NEGATIVE FOR INTRAEPITHELIAL LESIONS OR MALIGNANCY.  10/07/2018   Final   FUNGAL ORGANISMS PRESENT CONSISTENT WITH CANDIDA SPP.  10/07/2018 TRICHOMONAS VAGINALIS PRESENT.  Final   Pap smear {done:10129} at today's visit.  -Breast Cancer screening indicated? {indicated:25516}  7. Chronic Disease/Pregnancy Condition follow up: {Follow up:25499}  - PCP follow up  Bethanne Ginger, [place] for [place]

## 2021-12-18 NOTE — Progress Notes (Unsigned)
Pt now wants Birth Control Pills

## 2021-12-18 NOTE — Progress Notes (Unsigned)
    [place] Partum Visit Note  Kristin Brock is a 26 y.o. [phone] female who presents for a postpartum visit. She is 6 weeks postpartum following a normal spontaneous vaginal delivery DiDi twins.  I have fully reviewed the prenatal and intrapartum course. The delivery was at [redacted]w[redacted]d gestational weeks.  Twins, SVD x 2. Anesthesia: epidural. Postpartum course has been unremarkable. Babies are doing well. Baby is feeding by bottle - [employer] Good Start. Bleeding no bleeding. Bowel function is normal. Bladder function is normal. Patient is sexually active. Contraception method is  None . Postpartum depression screening: negative.   The pregnancy intention screening data noted above was reviewed. Potential methods of contraception were discussed. The patient elected to proceed with No data recorded.    Health Maintenance Due  Topic Date Due   HPV VACCINES (1 - 2-dose series) Never done   COVID-19 Vaccine (3 - Pfizer series) 10/28/2019   PAP-Cervical Cytology Screening  10/06/2021   PAP SMEAR-Modifier  10/06/2021   INFLUENZA VACCINE  Never done    Medical record  Review of Systems Pertinent items noted in HPI and remainder of comprehensive ROS otherwise negative.  Objective:  LMP 02/26/2021    General:  alert   Breasts:  not indicated  Lungs: clear to auscultation bilaterally  Heart:  regular rate and rhythm, S1, S2 normal, no murmur, click, rub or gallop  Abdomen: soft, non-tender; bowel sounds normal; no masses,  no organomegaly   Wound NA  GU exam:  not indicated       Assessment:    There are no diagnoses linked to this encounter.  NL postpartum exam.  Contraception Management Plan:   Essential components of care per ACOG recommendations:  1.  Mood and well being: Patient with negative depression screening today. Reviewed local resources for support.  - Patient tobacco use? No.   - hx of drug use? No.    2. Infant care and feeding:  -Patient currently breastmilk feeding?  No.  -Social determinants of health (SDOH) reviewed in EPIC. No concerns  3. Sexuality, contraception and birth spacing - Patient does not want a pregnancy in the next year.  Desired family size is uncertain  - Reviewed reproductive life planning. Reviewed contraceptive methods based on pt preferences and effectiveness.  Patient desired Oral Contraceptive today.   - Discussed birth spacing of 18 months  4. Sleep and fatigue -Encouraged family/partner/community support of 4 hrs of uninterrupted sleep to help with mood and fatigue  5. Physical Recovery  - Discussed patients delivery and complications. She describes her labor as good. - Patient had a Vaginal, no problems at delivery.Perineal healing reviewed. Patient expressed understanding - Patient has urinary incontinence? No. - Patient is safe to resume physical and sexual activity  6.  Health Maintenance - HM due items addressed Yes - Last pap smear  Diagnosis  Date Value Ref Range Status  10/07/2018   Final   NEGATIVE FOR INTRAEPITHELIAL LESIONS OR MALIGNANCY.  10/07/2018   Final   FUNGAL ORGANISMS PRESENT CONSISTENT WITH CANDIDA SPP.  10/07/2018 TRICHOMONAS VAGINALIS PRESENT.  Final   Pap smear not done at today's visit. Pt declined. Desires to schedule -Breast Cancer screening indicated? No.   7. Chronic Disease/Pregnancy Condition follow up: None  - PCP follow up  Arlina Robes, [place] for [facility], [place]

## 2022-01-31 ENCOUNTER — Encounter (HOSPITAL_COMMUNITY): Payer: Self-pay

## 2022-01-31 ENCOUNTER — Emergency Department (HOSPITAL_COMMUNITY): Payer: Medicaid Other

## 2022-01-31 ENCOUNTER — Other Ambulatory Visit: Payer: Self-pay

## 2022-01-31 ENCOUNTER — Emergency Department (HOSPITAL_COMMUNITY)
Admission: EM | Admit: 2022-01-31 | Discharge: 2022-01-31 | Disposition: A | Discharge status: Home / Self Care | Payer: Medicaid Other | Attending: Emergency Medicine | Admitting: Emergency Medicine

## 2022-01-31 DIAGNOSIS — S6991XA Unspecified injury of right wrist, hand and finger(s), initial encounter: Secondary | ICD-10-CM | POA: Diagnosis not present

## 2022-01-31 DIAGNOSIS — Z23 Encounter for immunization: Secondary | ICD-10-CM | POA: Diagnosis not present

## 2022-01-31 DIAGNOSIS — S0990XA Unspecified injury of head, initial encounter: Secondary | ICD-10-CM | POA: Diagnosis not present

## 2022-01-31 DIAGNOSIS — W010XXA Fall on same level from slipping, tripping and stumbling without subsequent striking against object, initial encounter: Secondary | ICD-10-CM | POA: Diagnosis not present

## 2022-01-31 MED ORDER — BACITRACIN ZINC 500 UNIT/GM EX OINT
TOPICAL_OINTMENT | Freq: Once | CUTANEOUS | Status: AC
Start: 2022-01-31 — End: 2022-01-31
  Administered 2022-01-31: 15.7778 via TOPICAL
  Filled 2022-01-31: qty 0.9

## 2022-01-31 MED ORDER — TETANUS-DIPHTH-ACELL PERTUSSIS 5-2.5-18.5 LF-MCG/0.5 IM SUSY
0.5000 mL | PREFILLED_SYRINGE | Freq: Once | INTRAMUSCULAR | Status: AC
  Administered 2022-01-31: 0.5 mL via INTRAMUSCULAR
  Filled 2022-01-31: qty 0.5

## 2022-01-31 NOTE — Discharge Instructions (Signed)
As we discussed, your work-up in the ER today was reassuring for acute findings.  X-ray and CT imaging did not reveal any emergent concerns.  I recommend that you clean your wounds with soap and water and keep dressings on them.  Monitor for signs of infection as this would be reason to return to the ER for continued evaluation and management of your symptoms.  Follow-up with your primary care doctor as needed.  Return if development of any new or worsening symptoms.
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
## 2022-01-31 NOTE — ED Notes (Addendum)
Pt. Took off c-collar and blood pressure cuff off.

## 2022-01-31 NOTE — ED Triage Notes (Signed)
BIBA from home for fall, was standing at the back door and slipped on leaves fell and hit back of head on concrete floor, no loc, states she was knocking on the door and her Rt hand went through the glass door and cut her hand, was drinking tonight

## 2022-03-28 ENCOUNTER — Ambulatory Visit (HOSPITAL_COMMUNITY): Payer: Medicaid Other | Admitting: Mental Health
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
## 2023-04-08 ENCOUNTER — Encounter (INDEPENDENT_AMBULATORY_CARE_PROVIDER_SITE_OTHER): Payer: Self-pay
